# Patient Record
Sex: Female | Born: 1995 | Race: White | Hispanic: No | Marital: Single | Smoking: Never smoker
Health system: Southern US, Community
[De-identification: ages and names within clinical notes are randomized; demographics above are authoritative.]

## PROBLEM LIST (undated history)

## (undated) DIAGNOSIS — J930 Spontaneous tension pneumothorax: Secondary | ICD-10-CM

## (undated) DIAGNOSIS — G43909 Migraine, unspecified, not intractable, without status migrainosus: Secondary | ICD-10-CM

## (undated) HISTORY — DX: Spontaneous tension pneumothorax: J93.0

## (undated) HISTORY — PX: WISDOM TOOTH EXTRACTION: SHX21

## (undated) HISTORY — PX: OTHER SURGICAL HISTORY: SHX169

## (undated) HISTORY — PX: TYMPANOSTOMY TUBE PLACEMENT: SHX32

## (undated) HISTORY — DX: Migraine, unspecified, not intractable, without status migrainosus: G43.909

---
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
## 2014-01-17 ENCOUNTER — Encounter: Payer: Self-pay | Admitting: Neurology

## 2014-01-17 ENCOUNTER — Ambulatory Visit (INDEPENDENT_AMBULATORY_CARE_PROVIDER_SITE_OTHER): Payer: BC Managed Care – PPO | Admitting: Neurology

## 2014-01-17 VITALS — BP 93/57 | HR 71 | Ht 66.0 in | Wt 125.0 lb

## 2014-01-17 DIAGNOSIS — G43009 Migraine without aura, not intractable, without status migrainosus: Secondary | ICD-10-CM

## 2014-01-17 NOTE — Progress Notes (Signed)
[facility]    Provider:  Dr Lucia Gaskins Referring Provider: No ref. provider found Primary Care Physician:  No primary care provider on file.  CC: Double vision  HPI: Lindsey Fitzgerald is a 19 y.o. female here as a follow up for migraines. She stopped the Topamax due to double vision. She is doing very well. Has had only 3 headaches. They lasted 2 hours or longer. She tried to sleep through them. Imitrex stopped her dad's heart when he was a teenager so she is very hesitant to try triptans. Toradol makes her hyper. Has not tried reglan. She uses fioricet and cambia acutely.    History of migraines from last appointment: The migraines started in 2nd grade. Father has migraines. Migraines start sometimes without an aura and the feel like general pain throughout head, at the back of the head, deep ehind eyes and something is burning a path from the back of her head to her eyes. Other times feels like someone is hitting her head with a hammer. She also gets pressure-type headaches. Headaches were 7-8/10 and would last from 30 minutes to days. Longest one was 4 days. Would have them 4-6 a week. +nausea. +phonophobia. Sleeping may help when it gets really bad. Only medication has really helped in the past. Triggers include weather changes such as bad thunderstorms, really cold or hot. She gets 8 hours of sleep. Is on regular sleep/wake cycle. MSG makes it worse as does artificial sugar. She was started on topamax  at night which has improved headaches.  caused hand paresthesias. Butterbur didn't help. Not sure if magnesium helped. Takes advil once a week for rescue. Doesn't take fioricet. No focal neurologic deficits during the headaches. Not sexually active. Not interested in having children anytime soon. She just started [employer]   Review of Systems: Patient complains of symptoms per HPI as well as the following symptoms: bruises/bleeds easily, nervous/anxious. Pertinent negatives  per HPI. All others negative.   History   Social History  . Marital Status: Single    Spouse Name: N/A    Number of Children: 0  . Years of Education: 12   Occupational History  . Not on file.   Social History Main Topics  . Smoking status: Never Smoker   . Smokeless tobacco: Never Used  . Alcohol Use: No  . Drug Use: No  . Sexual Activity: Not on file   Other Topics Concern  . Not on file   Social History Narrative   Patient is single with no children.   Patient is right handed.   Patient has a high school education and currently in college.   Patient drinks caffeine occasionally.    Family History  Problem Relation Age of Onset  . Diabetes Mother   . Hyperlipidemia Mother   . Hypertension Mother   . Other Mother     MENIERES  . Migraines Father   . Other Father     Brain Tumor  . Stroke Maternal Grandmother   . Stroke Maternal Grandfather     Past Medical History  Diagnosis Date  . Tension pneumothorax   . Premature baby   . Migraine     Past Surgical History  Procedure Laterality Date  . None      Current Outpatient Prescriptions  Medication Sig Dispense Refill  . Butalbital-APAP-Caffeine (FIORICET) 50-300-40 MG CAPS Take by mouth.    . Diclofenac Potassium (CAMBIA) 50 MG PACK Take 50 mg by mouth.    Marland Kitchen ibuprofen (ADVIL,MOTRIN)  600 MG tablet 1 tab by mouth every 8 hours as needed for pain    . mometasone (ELOCON) 0.1 % cream Apply to affected area daily    . propranolol ER (INDERAL LA) 60 MG 24 hr capsule Take 1 capsule (60 mg total) by mouth daily. 90 capsule 3   No current facility-administered medications for this visit.    Allergies as of 01/17/2014 - Review Complete 01/17/2014  Allergen Reaction Noted  . Ketorolac tromethamine Other (See Comments) 08/26/2013  . Sumatriptan Other (See Comments) 08/26/2013    Vitals: BP 93/57 mmHg  Pulse 71  Ht 5\' 6"  (1.676 m)  Wt 125 lb (56.7 kg)  BMI 20.19 kg/m2 Last Weight:  Wt Readings from  Last 1 Encounters:  01/17/14 125 lb (56.7 kg) (45 %*, Z = -0.12)   * Growth percentiles are based on CDC 2-20 Years data.   Last Height:   Ht Readings from Last 1 Encounters:  01/17/14 5\' 6"  (1.676 m) (75 %*, Z = 0.67)   * Growth percentiles are based on CDC 2-20 Years data.   Physical exam: Exam: Gen: NAD, conversant, well nourised, obese, well groomed                     CV: RRR, no MRG. No Carotid Bruits. No peripheral edema, warm, nontender Eyes: Conjunctivae clear without exudates or hemorrhage  Neuro: Detailed Neurologic Exam  Speech:    Speech is normal; fluent and spontaneous with normal comprehension.  Cognition:    The patient is oriented to person, place, and time;     recent and remote memory intact;     language fluent;     normal attention, concentration,     fund of knowledge Cranial Nerves:    The pupils are equal, round, and reactive to light. The fundi are normal and spontaneous venous pulsations are present. Visual fields are full to finger confrontation. Extraocular movements are intact. Trigeminal sensation is intact and the muscles of mastication are normal. The face is symmetric. The palate elevates in the midline. Hearing intact. Voice is normal. Shoulder shrug is normal. The tongue has normal motion without fasciculations.   Coordination:    Normal finger to nose and heel to shin. Normal rapid alternating movements.   Gait:    Heel-toe and tandem gait are normal.   Motor Observation:    No asymmetry, no atrophy, and no involuntary movements noted. Tone:    Normal muscle tone.    Posture:    Posture is normal. normal erect    Strength:    Strength is V/V in the upper and lower limbs.      Sensation: intact     Reflex Exam:  DTR's:    Deep tendon reflexes in the upper and lower extremities are normal bilaterally.   Toes:    The toes are downgoing bilaterally.   Clonus:    Clonus is absent.       Assessment/Plan:  19 year old here  for follow up of chronic migraines. Neuro exam is normal. She is on propranolol and doing well. Only 3 migraines since last being seen. Doesn't want a triptan. She has fioricet and cambia, neither has worked on her 3 headaches. She has not tried them together. Try them together for acute management, can try reglan next.   Naomie DeanAntonia Iman Reinertsen, MD  [facility] [address] [place], [place] [id number]  Phone [phone] Fax [phone]  A total of 20 minutes  was spent in with this patient. Over half this time was spent on counseling patient on the diagnosis and different therapeutic options available.

## 2014-01-26 ENCOUNTER — Other Ambulatory Visit: Payer: Self-pay | Admitting: Orthopedic Surgery

## 2014-01-26 DIAGNOSIS — M25562 Pain in left knee: Secondary | ICD-10-CM

## 2014-02-04 ENCOUNTER — Ambulatory Visit
Admission: RE | Admit: 2014-02-04 | Discharge: 2014-02-04 | Disposition: A | Discharge status: Home / Self Care | Payer: BC Managed Care – PPO | Source: Ambulatory Visit | Attending: Orthopedic Surgery | Admitting: Orthopedic Surgery

## 2014-02-04 ENCOUNTER — Other Ambulatory Visit: Payer: Self-pay

## 2014-02-04 DIAGNOSIS — M25562 Pain in left knee: Secondary | ICD-10-CM

## 2014-02-27 ENCOUNTER — Ambulatory Visit: Payer: No Typology Code available for payment source | Admitting: Neurology

## 2014-03-28 ENCOUNTER — Telehealth: Payer: Self-pay | Admitting: Neurology

## 2014-03-28 ENCOUNTER — Other Ambulatory Visit: Payer: Self-pay | Admitting: *Deleted

## 2014-03-28 DIAGNOSIS — G43009 Migraine without aura, not intractable, without status migrainosus: Secondary | ICD-10-CM

## 2014-03-28 MED ORDER — PROCHLORPERAZINE EDISYLATE 5 MG/ML IJ SOLN: 10.0000 mg | Freq: Once | INTRAMUSCULAR | Status: DC

## 2014-03-28 MED ORDER — VALPROATE SODIUM 500 MG/5ML IV SOLN: 1000.0000 mg | INTRAVENOUS | Status: DC

## 2014-03-28 MED ORDER — VALPROATE SODIUM 500 MG/5ML IV SOLN: 500.0000 mg | INTRAVENOUS | Status: DC

## 2014-03-28 MED ORDER — SODIUM CHLORIDE 0.9 % IV SOLN: 500.0000 mg | INTRAVENOUS | Status: DC

## 2014-03-28 NOTE — Telephone Encounter (Signed)
Spoke with Dr. Lucia GaskinsAhern and she said she spoke with pt and told her to come at 300pm today for a migraine cocktail with Inetta Fermoina. I informed Inetta Fermoina and she is aware.

## 2014-03-28 NOTE — Telephone Encounter (Signed)
Patient stated she's having a really bad migraine and medication isn't helping.Marland Kitchen.Marland Kitchen.Please call and advise.

## 2014-03-28 NOTE — Telephone Encounter (Signed)
Kara Meadmma - can you let patient know to schedule a follow up with me in the office if headaches continue to worsen? Thanks

## 2014-04-10 ENCOUNTER — Encounter: Payer: Self-pay | Admitting: Certified Nurse Midwife

## 2014-04-10 ENCOUNTER — Ambulatory Visit (INDEPENDENT_AMBULATORY_CARE_PROVIDER_SITE_OTHER): Payer: BC Managed Care – PPO | Admitting: Certified Nurse Midwife

## 2014-04-10 VITALS — BP 100/64 | HR 68 | Resp 16 | Ht 65.0 in | Wt 123.0 lb

## 2014-04-10 DIAGNOSIS — Z3009 Encounter for other general counseling and advice on contraception: Secondary | ICD-10-CM

## 2014-04-10 DIAGNOSIS — Z0000 Encounter for general adult medical examination without abnormal findings: Secondary | ICD-10-CM

## 2014-04-10 DIAGNOSIS — Z01419 Encounter for gynecological examination (general) (routine) without abnormal findings: Secondary | ICD-10-CM

## 2014-04-10 LAB — POCT URINALYSIS DIPSTICK
BILIRUBIN UA: NEGATIVE
Glucose, UA: NEGATIVE
KETONES UA: NEGATIVE
Leukocytes, UA: NEGATIVE
Nitrite, UA: NEGATIVE
PH UA: 5
PROTEIN UA: NEGATIVE
RBC UA: NEGATIVE
Urobilinogen, UA: NEGATIVE

## 2014-04-10 NOTE — Progress Notes (Signed)
Reviewed personally.  M. Suzanne Aundreya Souffrant, MD.  

## 2014-04-10 NOTE — Progress Notes (Signed)
19 y.o. G0P0000 Single  Caucasian Fe here to establish gyn care, first gyn visit and  for annual exam. Periods normal, occasional cramping that requires OTC. Duration 5 days. History of migraine headache with aura with medication management with Neurology Dr. Ward Givens . Student at [employer]. Sees Urgent care if needed while here in [place]. Sexually active with one partner only and same for partner. Touching only. Interested in contraception options to be responsible and for protection from pregnancy. Mother aware patient is here today. No health issues today.  Patient's last menstrual period was 03/20/2014.          Sexually active: Yes.   but no intercourse.    Exercising: Yes.    gym & yoga Smoker:  no  Health Maintenance: Pap:  none MMG:  none Colonoscopy:  none BMD:   none TDaP:  5/15 Labs: Poct urine-neg Self breast exam: done monthly   reports that she has never smoked. She has never used smokeless tobacco. She reports that she does not drink alcohol or use illicit drugs.  Past Medical History  Diagnosis Date  . Tension pneumothorax   . Premature baby   . Migraine     Past Surgical History  Procedure Laterality Date  . None    . Wisdom tooth extraction    . Tympanostomy tube placement      Current Outpatient Prescriptions  Medication Sig Dispense Refill  . Butalbital-APAP-Caffeine (FIORICET) 50-300-40 MG CAPS Take by mouth.    . Diclofenac Potassium (CAMBIA) 50 MG PACK Take 50 mg by mouth.    Marland Kitchen ibuprofen (ADVIL,MOTRIN) 600 MG tablet 1 tab by mouth every 8 hours as needed for pain    . propranolol ER (INDERAL LA) 60 MG 24 hr capsule Take 1 capsule (60 mg total) by mouth daily. 90 capsule 3  . mometasone (ELOCON) 0.1 % cream Apply to affected area prn     Current Facility-Administered Medications  Medication Dose Route Frequency Provider Last Rate Last Dose  . prochlorperazine (COMPAZINE) injection 10 mg  10 mg Intravenous Once Anson Fret, MD        Family History   Problem Relation Age of Onset  . Diabetes Mother   . Hyperlipidemia Mother   . Hypertension Mother   . Other Mother     MENIERES  . Migraines Father   . Other Father     Brain Tumor  . Stroke Maternal Grandmother   . Stroke Maternal Grandfather   . Hypertension Maternal Grandfather   . Diabetes Paternal Uncle     ROS:  Pertinent items are noted in HPI.  Otherwise, a comprehensive ROS was negative.  Exam:   BP 100/64 mmHg  Pulse 68  Resp 16  Ht  (1.651 m)  Wt 123 lb (55.792 kg)  BMI 20.47 kg/m2  LMP 03/20/2014 Height:  (165.1 cm) Ht Readings from Last 3 Encounters:  04/10/14  (1.651 m) (61 %*, Z = 0.28)  01/17/14  (1.676 m) (75 %*, Z = 0.67)  09/02/13  (1.676 m) (75 %*, Z = 0.68)   * Growth percentiles are based on CDC 2-20 Years data.    General appearance: alert, cooperative and appears stated age Head: Normocephalic, without obvious abnormality, atraumatic Neck: no adenopathy, supple, symmetrical, trachea midline and thyroid normal to inspection and palpation Lungs: clear to auscultation bilaterally Breasts: normal appearance, no masses or tenderness, No nipple retraction or dimpling, No nipple discharge or bleeding, No axillary or supraclavicular adenopathy  Heart: regular rate and rhythm Abdomen: soft, non-tender; no masses,  no organomegaly Extremities: extremities normal, atraumatic, no cyanosis or edema Skin: Skin color, texture, turgor normal. No rashes or lesions Lymph nodes: Cervical, supraclavicular, and axillary nodes normal. No abnormal inguinal nodes palpated Neurologic: Grossly normal   Pelvic: External genitalia:  no lesions              Urethra:  normal appearing urethra with no masses, tenderness or lesions              Bartholin's and Skene's: normal                 Vagina: normal appearing vagina with normal color and discharge, no lesions virginal, hymen still intact              Cervix: non tender, no lesions, normal               Pap taken: No. Bimanual Exam:  Uterus:  normal size, contour, position, consistency, mobility, non-tender              Adnexa: normal adnexa and no mass, fullness, tenderness               Rectovaginal: Confirms               Anus:  normal appearnace  Chaperone present: Yes  A:  Well Woman with normal exam  Contraception desired   History of Migraine with aura  P:   Reviewed health and wellness pertinent to exam  Discussed risks and benefits of POP, Depo Provera, Nexplanon and IUD. Discussed not a candidate for estrogen products due to Migraine with aura and increase risk of stroke or DVT with use. Questions addressed regarding options. Patient shown Nexplanon device and feels this is a good choice for her due to schedule with college and remembering pills. Discussed insertion/removal, bleeding expectations, and need for insertion day 1-5 of menses and no concerns with pregnancy, so if becomes sexually active before insertion must use condoms. Patient has discussed Nexplanon with her mother and agrees a good choice. Will precert insurance and someone will call her with information and then she needs to call when menses starts.  Pap smear not taken today   counseled on breast self exam, STD prevention, HIV risk factors and prevention, family planning choices, adequate intake of calcium and vitamin D, diet and exercise  return annually or prn  An After Visit Summary was printed and given to the patient.

## 2014-04-10 NOTE — Patient Instructions (Signed)
General topics  Next pap or exam is  due in 1 year Take a Women's multivitamin Take 1200 mg. of calcium daily - prefer dietary If any concerns in interim to call back  Breast Self-Awareness Practicing breast self-awareness may pick up problems early, prevent significant medical complications, and possibly save your life. By practicing breast self-awareness, you can become familiar with how your breasts look and feel and if your breasts are changing. This allows you to notice changes early. It can also offer you some reassurance that your breast health is good. One way to learn what is normal for your breasts and whether your breasts are changing is to do a breast self-exam. If you find a lump or something that was not present in the past, it is best to contact your caregiver right away. Other findings that should be evaluated by your caregiver include nipple discharge, especially if it is bloody; skin changes or reddening; areas where the skin seems to be pulled in (retracted); or new lumps and bumps. Breast pain is seldom associated with cancer (malignancy), but should also be evaluated by a caregiver. BREAST SELF-EXAM The best time to examine your breasts is 5 7 days after your menstrual period is over.  ExitCare Patient Information 2013 [employer].   Exercise to Stay Healthy Exercise helps you become and stay healthy. EXERCISE IDEAS AND TIPS Choose exercises that:  You enjoy.  Fit into your day. You do not need to exercise really hard to be healthy. You can do exercises at a slow or medium level and stay healthy. You can:  Stretch before and after working out.  Try yoga, Pilates, or tai chi.  Lift weights.  Walk fast, swim, jog, run, climb stairs, bicycle, dance, or rollerskate.  Take aerobic classes. Exercises that burn about 150 calories:  Running 1  miles in 15 minutes.  Playing volleyball for 45 to 60 minutes.  Washing and waxing a car for 45 to 60  minutes.  Playing touch football for 45 minutes.  Walking 1  miles in 35 minutes.  Pushing a stroller 1  miles in 30 minutes.  Playing basketball for 30 minutes.  Raking leaves for 30 minutes.  Bicycling 5 miles in 30 minutes.  Walking 2 miles in 30 minutes.  Dancing for 30 minutes.  Shoveling snow for 15 minutes.  Swimming laps for 20 minutes.  Walking up stairs for 15 minutes.  Bicycling 4 miles in 15 minutes.  Gardening for 30 to 45 minutes.  Jumping rope for 15 minutes.  Washing windows or floors for 45 to 60 minutes. Document Released: 01/25/2010 Document Revised: 03/17/2011 Document Reviewed: 01/25/2010 ExitCare Patient Information 2013 [employer].   Other topics ( that may be useful information):    Sexually Transmitted Disease Sexually transmitted disease (STD) refers to any infection that is passed from person to person during sexual activity. This may happen by way of saliva, semen, blood, vaginal mucus, or urine. Common STDs include:  Gonorrhea.  Chlamydia.  Syphilis.  HIV/AIDS.  Genital herpes.  Hepatitis B and C.  Trichomonas.  Human papillomavirus (HPV).  Pubic lice. CAUSES  An STD may be spread by bacteria, virus, or parasite. A person can get an STD by:  Sexual intercourse with an infected person.  Sharing sex toys with an infected person.  Sharing needles with an infected person.  Having intimate contact with the genitals, mouth, or rectal areas of an infected person. SYMPTOMS  Some people may not have any symptoms, but   they can still pass the infection to others. Different STDs have different symptoms. Symptoms include:  Painful or bloody urination.  Pain in the pelvis, abdomen, vagina, anus, throat, or eyes.  Skin rash, itching, irritation, growths, or sores (lesions). These usually occur in the genital or anal area.  Abnormal vaginal discharge.  Penile discharge in men.  Soft, flesh-colored skin growths in the  genital or anal area.  Fever.  Pain or bleeding during sexual intercourse.  Swollen glands in the groin area.  Yellow skin and eyes (jaundice). This is seen with hepatitis. DIAGNOSIS  To make a diagnosis, your caregiver may:  Take a medical history.  Perform a physical exam.  Take a specimen (culture) to be examined.  Examine a sample of discharge under a microscope.  Perform blood test TREATMENT   Chlamydia, gonorrhea, trichomonas, and syphilis can be cured with antibiotic medicine.  Genital herpes, hepatitis, and HIV can be treated, but not cured, with prescribed medicines. The medicines will lessen the symptoms.  Genital warts from HPV can be treated with medicine or by freezing, burning (electrocautery), or surgery. Warts may come back.  HPV is a virus and cannot be cured with medicine or surgery.However, abnormal areas may be followed very closely by your caregiver and may be removed from the cervix, vagina, or vulva through office procedures or surgery. If your diagnosis is confirmed, your recent sexual partners need treatment. This is true even if they are symptom-free or have a negative culture or evaluation. They should not have sex until their caregiver says it is okay. HOME CARE INSTRUCTIONS  All sexual partners should be informed, tested, and treated for all STDs.  Take your antibiotics as directed. Finish them even if you start to feel better.  Only take over-the-counter or prescription medicines for pain, discomfort, or fever as directed by your caregiver.  Rest.  Eat a balanced diet and drink enough fluids to keep your urine clear or pale yellow.  Do not have sex until treatment is completed and you have followed up with your caregiver. STDs should be checked after treatment.  Keep all follow-up appointments, Pap tests, and blood tests as directed by your caregiver.  Only use latex condoms and water-soluble lubricants during sexual activity. Do not use  petroleum jelly or oils.  Avoid alcohol and illegal drugs.  Get vaccinated for HPV and hepatitis. If you have not received these vaccines in the past, talk to your caregiver about whether one or both might be right for you.  Avoid risky sex practices that can break the skin. The only way to avoid getting an STD is to avoid all sexual activity.Latex condoms and dental dams (for oral sex) will help lessen the risk of getting an STD, but will not completely eliminate the risk. SEEK MEDICAL CARE IF:   You have a fever.  You have any new or worsening symptoms. Document Released: 03/15/2002 Document Revised: 03/17/2011 Document Reviewed: 03/22/2010 [facility] Patient Information 2013 [place].    Domestic Abuse You are being battered or abused if someone close to you hits, pushes, or physically hurts you in any way. You also are being abused if you are forced into activities. You are being sexually abused if you are forced to have sexual contact of any kind. You are being emotionally abused if you are made to feel worthless or if you are constantly threatened. It is important to remember that help is available. No one has the right to abuse you. PREVENTION OF FURTHER  ABUSE  Learn the warning signs of danger. This varies with situations but may include: the use of alcohol, threats, isolation from friends and family, or forced sexual contact. Leave if you feel that violence is going to occur.  If you are attacked or beaten, report it to the police so the abuse is documented. You do not have to press charges. The police can protect you while you or the attackers are leaving. Get the officer's name and badge number and a copy of the report.  Find someone you can trust and tell them what is happening to you: your caregiver, a nurse, clergy member, close friend or family member. Feeling ashamed is natural, but remember that you have done nothing wrong. No one deserves abuse. Document Released:  12/21/1999 Document Revised: 03/17/2011 Document Reviewed: 02/28/2010 ExitCare Patient Information 2013 [employer].    How Much is Too Much Alcohol? Drinking too much alcohol can cause injury, accidents, and health problems. These types of problems can include:   Car crashes.  Falls.  Family fighting (domestic violence).  Drowning.  Fights.  Injuries.  Burns.  Damage to certain organs.  Having a baby with birth defects. ONE DRINK CAN BE TOO MUCH WHEN YOU ARE:  Working.  Pregnant or breastfeeding.  Taking medicines. Ask your doctor.  Driving or planning to drive. If you or someone you know has a drinking problem, get help from a doctor.  Document Released: 10/19/2008 Document Revised: 03/17/2011 Document Reviewed: 10/19/2008 ExitCare Patient Information 2013 [employer].   Smoking Hazards Smoking cigarettes is extremely bad for your health. Tobacco smoke has over 200 known poisons in it. There are over 60 chemicals in tobacco smoke that cause cancer. Some of the chemicals found in cigarette smoke include:   Cyanide.  Benzene.  Formaldehyde.  Methanol (wood alcohol).  Acetylene (fuel used in welding torches).  Ammonia. Cigarette smoke also contains the poisonous gases nitrogen oxide and carbon monoxide.  Cigarette smokers have an increased risk of many serious medical problems and Smoking causes approximately:  90% of all lung cancer deaths in men.  80% of all lung cancer deaths in women.  90% of deaths from chronic obstructive lung disease. Compared with nonsmokers, smoking increases the risk of:  Coronary heart disease by 2 to 4 times.  Stroke by 2 to 4 times.  Men developing lung cancer by 23 times.  Women developing lung cancer by 13 times.  Dying from chronic obstructive lung diseases by 12 times.  . Smoking is the most preventable cause of death and disease in our society.  WHY IS SMOKING ADDICTIVE?  Nicotine is the chemical  agent in tobacco that is capable of causing addiction or dependence.  When you smoke and inhale, nicotine is absorbed rapidly into the bloodstream through your lungs. Nicotine absorbed through the lungs is capable of creating a powerful addiction. Both inhaled and non-inhaled nicotine may be addictive.  Addiction studies of cigarettes and spit tobacco show that addiction to nicotine occurs mainly during the teen years, when young people begin using tobacco products. WHAT ARE THE BENEFITS OF QUITTING?  There are many health benefits to quitting smoking.   Likelihood of developing cancer and heart disease decreases. Health improvements are seen almost immediately.  Blood pressure, pulse rate, and breathing patterns start returning to normal soon after quitting. QUITTING SMOKING   American Lung Association - 1-800-LUNGUSA  American Cancer Society - 1-800-ACS-2345 Document Released: 01/31/2004 Document Revised: 03/17/2011 Document Reviewed: 10/04/2008 ExitCare Patient Information 2013 ExitCare,   LLC.   Stress Management Stress is a state of physical or mental tension that often results from changes in your life or normal routine. Some common causes of stress are:  Death of a loved one.  Injuries or severe illnesses.  Getting fired or changing jobs.  Moving into a new home. Other causes may be:  Sexual problems.  Business or financial losses.  Taking on a large debt.  Regular conflict with someone at home or at work.  Constant tiredness from lack of sleep. It is not just bad things that are stressful. It may be stressful to:  Win the lottery.  Get married.  Buy a new car. The amount of stress that can be easily tolerated varies from person to person. Changes generally cause stress, regardless of the types of change. Too much stress can affect your health. It may lead to physical or emotional problems. Too little stress (boredom) may also become stressful. SUGGESTIONS TO  REDUCE STRESS:  Talk things over with your family and friends. It often is helpful to share your concerns and worries. If you feel your problem is serious, you may want to get help from a professional counselor.  Consider your problems one at a time instead of lumping them all together. Trying to take care of everything at once may seem impossible. List all the things you need to do and then start with the most important one. Set a goal to accomplish 2 or 3 things each day. If you expect to do too many in a single day you will naturally fail, causing you to feel even more stressed.  Do not use alcohol or drugs to relieve stress. Although you may feel better for a short time, they do not remove the problems that caused the stress. They can also be habit forming.  Exercise regularly - at least 3 times per week. Physical exercise can help to relieve that "uptight" feeling and will relax you.  The shortest distance between despair and hope is often a good night's sleep.  Go to bed and get up on time allowing yourself time for appointments without being rushed.  Take a short "time-out" period from any stressful situation that occurs during the day. Close your eyes and take some deep breaths. Starting with the muscles in your face, tense them, hold it for a few seconds, then relax. Repeat this with the muscles in your neck, shoulders, hand, stomach, back and legs.  Take good care of yourself. Eat a balanced diet and get plenty of rest.  Schedule time for having fun. Take a break from your daily routine to relax. HOME CARE INSTRUCTIONS   Call if you feel overwhelmed by your problems and feel you can no longer manage them on your own.  Return immediately if you feel like hurting yourself or someone else. Document Released: 06/18/2000 Document Revised: 03/17/2011 Document Reviewed: 02/08/2007 ExitCare Patient Information 2013 [employer]Care[employer].  Sexually Transmitted Disease A sexually transmitted  disease (STD) is a disease or infection that may be passed (transmitted) from person to person, usually during sexual activity. This may happen by way of saliva, semen, blood, vaginal mucus, or urine. Common STDs include:   Gonorrhea.   Chlamydia.   Syphilis.   HIV and AIDS.   Genital herpes.   Hepatitis B and C.   Trichomonas.   Human papillomavirus (HPV).   Pubic lice.   Scabies.  Mites.  Bacterial vaginosis. WHAT ARE CAUSES OF STDs? An STD may be caused by   bacteria, a virus, or parasites. STDs are often transmitted during sexual activity if one person is infected. However, they may also be transmitted through nonsexual means. STDs may be transmitted after:   Sexual intercourse with an infected person.   Sharing sex toys with an infected person.   Sharing needles with an infected person or using unclean piercing or tattoo needles.  Having intimate contact with the genitals, mouth, or rectal areas of an infected person.   Exposure to infected fluids during birth. WHAT ARE THE SIGNS AND SYMPTOMS OF STDs? Different STDs have different symptoms. Some people may not have any symptoms. If symptoms are present, they may include:   Painful or bloody urination.   Pain in the pelvis, abdomen, vagina, anus, throat, or eyes.   A skin rash, itching, or irritation.  Growths, ulcerations, blisters, or sores in the genital and anal areas.  Abnormal vaginal discharge with or without bad odor.   Penile discharge in men.   Fever.   Pain or bleeding during sexual intercourse.   Swollen glands in the groin area.   Yellow skin and eyes (jaundice). This is seen with hepatitis.   Swollen testicles.  Infertility.  Sores and blisters in the mouth. HOW ARE STDs DIAGNOSED? To make a diagnosis, your health care provider may:   Take a medical history.   Perform a physical exam.   Take a sample of any discharge to examine.  Swab the throat, cervix,  opening to the penis, rectum, or vagina for testing.  Test a sample of your first morning urine.   Perform blood tests.   Perform a Pap test, if this applies.   Perform a colposcopy.   Perform a laparoscopy.  HOW ARE STDs TREATED? Treatment depends on the STD. Some STDs may be treated but not cured.   Chlamydia, gonorrhea, trichomonas, and syphilis can be cured with antibiotic medicine.   Genital herpes, hepatitis, and HIV can be treated, but not cured, with prescribed medicines. The medicines lessen symptoms.   Genital warts from HPV can be treated with medicine or by freezing, burning (electrocautery), or surgery. Warts may come back.   HPV cannot be cured with medicine or surgery. However, abnormal areas may be removed from the cervix, vagina, or vulva.   If your diagnosis is confirmed, your recent sexual partners need treatment. This is true even if they are symptom-free or have a negative culture or evaluation. They should not have sex until their health care providers say it is okay. HOW CAN I REDUCE MY RISK OF GETTING AN STD? Take these steps to reduce your risk of getting an STD:  Use latex condoms, dental dams, and water-soluble lubricants during sexual activity. Do not use petroleum jelly or oils.  Avoid having multiple sex partners.  Do not have sex with someone who has other sex partners.  Do not have sex with anyone you do not know or who is at high risk for an STD.  Avoid risky sex practices that can break your skin.  Do not have sex if you have open sores on your mouth or skin.  Avoid drinking too much alcohol or taking illegal drugs. Alcohol and drugs can affect your judgment and put you in a vulnerable position.  Avoid engaging in oral and anal sex acts.  Get vaccinated for HPV and hepatitis. If you have not received these vaccines in the past, talk to your health care provider about whether one or both might be right for you.     If you are at  risk of being infected with HIV, it is recommended that you take a prescription medicine daily to prevent HIV infection. This is called pre-exposure prophylaxis (PrEP). You are considered at risk if:  You are a man who has sex with other men (MSM).  You are a heterosexual man or woman and are sexually active with more than one partner.  You take drugs by injection.  You are sexually active with a partner who has HIV.  Talk with your health care provider about whether you are at high risk of being infected with HIV. If you choose to begin PrEP, you should first be tested for HIV. You should then be tested every 3 months for as long as you are taking PrEP.  WHAT SHOULD I DO IF I THINK I HAVE AN STD?  See your health care provider.   Tell your sexual partner(s). They should be tested and treated for any STDs.  Do not have sex until your health care provider says it is okay. WHEN SHOULD I GET IMMEDIATE MEDICAL CARE? Contact your health care provider right away if:   You have severe abdominal pain.  You are a man and notice swelling or pain in your testicles.  You are a woman and notice swelling or pain in your vagina. Document Released: 03/15/2002 Document Revised: 12/28/2012 Document Reviewed: 07/13/2012 [facility] Patient Information 2015 [place], [place]. This information is not intended to replace advice given to you by your health care provider. Make sure you discuss any questions you have with your health care provider. Etonogestrel implant What is this medicine? ETONOGESTREL (et oh noe JES trel) is a contraceptive (birth control) device. It is used to prevent pregnancy. It can be used for up to 3 years. This medicine may be used for other purposes; ask your health care provider or pharmacist if you have questions. COMMON BRAND NAME(S): Implanon, Nexplanon What should I tell my health care provider before I take this medicine? They need to know if you have any of these  conditions: -abnormal vaginal bleeding -blood vessel disease or blood clots -cancer of the breast, cervix, or liver -depression -diabetes -gallbladder disease -headaches -heart disease or recent heart attack -high blood pressure -high cholesterol -kidney disease -liver disease -renal disease -seizures -tobacco smoker -an unusual or allergic reaction to etonogestrel, other hormones, anesthetics or antiseptics, medicines, foods, dyes, or preservatives -pregnant or trying to get pregnant -breast-feeding How should I use this medicine? This device is inserted just under the skin on the inner side of your upper arm by a health care professional. Talk to your pediatrician regarding the use of this medicine in children. Special care may be needed. Overdosage: If you think you've taken too much of this medicine contact a poison control center or emergency room at once. Overdosage: If you think you have taken too much of this medicine contact a poison control center or emergency room at once. NOTE: This medicine is only for you. Do not share this medicine with others. What if I miss a dose? This does not apply. What may interact with this medicine? Do not take this medicine with any of the following medications: -amprenavir -bosentan -fosamprenavir This medicine may also interact with the following medications: -barbiturate medicines for inducing sleep or treating seizures -certain medicines for fungal infections like ketoconazole and itraconazole -griseofulvin -medicines to treat seizures like carbamazepine, felbamate, oxcarbazepine, phenytoin, topiramate -modafinil -phenylbutazone -rifampin -some medicines to treat HIV infection like atazanavir, indinavir, lopinavir, nelfinavir, tipranavir,  ritonavir -St. John's wort This list may not describe all possible interactions. Give your health care provider a list of all the medicines, herbs, non-prescription drugs, or dietary supplements  you use. Also tell them if you smoke, drink alcohol, or use illegal drugs. Some items may interact with your medicine. What should I watch for while using this medicine? This product does not protect you against HIV infection (AIDS) or other sexually transmitted diseases. You should be able to feel the implant by pressing your fingertips over the skin where it was inserted. Tell your doctor if you cannot feel the implant. What side effects may I notice from receiving this medicine? Side effects that you should report to your doctor or health care professional as soon as possible: -allergic reactions like skin rash, itching or hives, swelling of the face, lips, or tongue -breast lumps -changes in vision -confusion, trouble speaking or understanding -dark urine -depressed mood -general ill feeling or flu-like symptoms -light-colored stools -loss of appetite, nausea -right upper belly pain -severe headaches -severe pain, swelling, or tenderness in the abdomen -shortness of breath, chest pain, swelling in a leg -signs of pregnancy -sudden numbness or weakness of the face, arm or leg -trouble walking, dizziness, loss of balance or coordination -unusual vaginal bleeding, discharge -unusually weak or tired -yellowing of the eyes or skin Side effects that usually do not require medical attention (Report these to your doctor or health care professional if they continue or are bothersome.): -acne -breast pain -changes in weight -cough -fever or chills -headache -irregular menstrual bleeding -itching, burning, and vaginal discharge -pain or difficulty passing urine -sore throat This list may not describe all possible side effects. Call your doctor for medical advice about side effects. You may report side effects to FDA at 1-800-FDA-1088. Where should I keep my medicine? This drug is given in a hospital or clinic and will not be stored at home. NOTE: This sheet is a summary. It may not  cover all possible information. If you have questions about this medicine, talk to your doctor, pharmacist, or health care provider.  2015, [employer]. (2011-06-30 15:37:45)

## 2014-04-11 ENCOUNTER — Telehealth: Payer: Self-pay | Admitting: Certified Nurse Midwife

## 2014-04-11 NOTE — Telephone Encounter (Signed)
Call to patient. Advised of benefit quote received for Nexplanon. Patient agreeable. Patient is to call with cycle.

## 2014-04-12 ENCOUNTER — Telehealth: Payer: Self-pay | Admitting: Certified Nurse Midwife

## 2014-04-12 NOTE — Telephone Encounter (Signed)
Patient was told to call when she started her cycle. Last seen 04/10/14.

## 2014-04-12 NOTE — Telephone Encounter (Signed)
Spoke with patient. Patient started cycle today and would like to schedule nexplanon insertion. Requesting appointment for tomorrow.Appointment scheduled for tomorrow at 10 am with Verner Choleborah S. Leonard CNM. Patient is agreeable to date and time.   Routing to provider for final review. Patient agreeable to disposition. Will close encounter

## 2014-04-13 ENCOUNTER — Encounter: Payer: Self-pay | Admitting: Certified Nurse Midwife

## 2014-04-13 ENCOUNTER — Ambulatory Visit (INDEPENDENT_AMBULATORY_CARE_PROVIDER_SITE_OTHER): Payer: BC Managed Care – PPO | Admitting: Certified Nurse Midwife

## 2014-04-13 VITALS — BP 102/68 | HR 70 | Resp 16 | Ht 65.0 in | Wt 125.0 lb

## 2014-04-13 DIAGNOSIS — Z308 Encounter for other contraceptive management: Secondary | ICD-10-CM

## 2014-04-13 DIAGNOSIS — Z30017 Encounter for initial prescription of implantable subdermal contraceptive: Secondary | ICD-10-CM

## 2014-04-13 DIAGNOSIS — Z3009 Encounter for other general counseling and advice on contraception: Secondary | ICD-10-CM

## 2014-04-13 LAB — POCT URINE PREGNANCY: PREG TEST UR: NEGATIVE

## 2014-04-13 NOTE — Patient Instructions (Signed)
Etonogestrel implant What is this medicine? ETONOGESTREL (et oh noe JES trel) is a contraceptive (birth control) device. It is used to prevent pregnancy. It can be used for up to 3 years. This medicine may be used for other purposes; ask your health care provider or pharmacist if you have questions. COMMON BRAND NAME(S): Implanon, Nexplanon What should I tell my health care provider before I take this medicine? They need to know if you have any of these conditions: -abnormal vaginal bleeding -blood vessel disease or blood clots -cancer of the breast, cervix, or liver -depression -diabetes -gallbladder disease -headaches -heart disease or recent heart attack -high blood pressure -high cholesterol -kidney disease -liver disease -renal disease -seizures -tobacco smoker -an unusual or allergic reaction to etonogestrel, other hormones, anesthetics or antiseptics, medicines, foods, dyes, or preservatives -pregnant or trying to get pregnant -breast-feeding How should I use this medicine? This device is inserted just under the skin on the inner side of your upper arm by a health care professional. Talk to your pediatrician regarding the use of this medicine in children. Special care may be needed. Overdosage: If you think you've taken too much of this medicine contact a poison control center or emergency room at once. Overdosage: If you think you have taken too much of this medicine contact a poison control center or emergency room at once. NOTE: This medicine is only for you. Do not share this medicine with others. What if I miss a dose? This does not apply. What may interact with this medicine? Do not take this medicine with any of the following medications: -amprenavir -bosentan -fosamprenavir This medicine may also interact with the following medications: -barbiturate medicines for inducing sleep or treating seizures -certain medicines for fungal infections like ketoconazole and  itraconazole -griseofulvin -medicines to treat seizures like carbamazepine, felbamate, oxcarbazepine, phenytoin, topiramate -modafinil -phenylbutazone -rifampin -some medicines to treat HIV infection like atazanavir, indinavir, lopinavir, nelfinavir, tipranavir, ritonavir -St. John's wort This list may not describe all possible interactions. Give your health care provider a list of all the medicines, herbs, non-prescription drugs, or dietary supplements you use. Also tell them if you smoke, drink alcohol, or use illegal drugs. Some items may interact with your medicine. What should I watch for while using this medicine? This product does not protect you against HIV infection (AIDS) or other sexually transmitted diseases. You should be able to feel the implant by pressing your fingertips over the skin where it was inserted. Tell your doctor if you cannot feel the implant. What side effects may I notice from receiving this medicine? Side effects that you should report to your doctor or health care professional as soon as possible: -allergic reactions like skin rash, itching or hives, swelling of the face, lips, or tongue -breast lumps -changes in vision -confusion, trouble speaking or understanding -dark urine -depressed mood -general ill feeling or flu-like symptoms -light-colored stools -loss of appetite, nausea -right upper belly pain -severe headaches -severe pain, swelling, or tenderness in the abdomen -shortness of breath, chest pain, swelling in a leg -signs of pregnancy -sudden numbness or weakness of the face, arm or leg -trouble walking, dizziness, loss of balance or coordination -unusual vaginal bleeding, discharge -unusually weak or tired -yellowing of the eyes or skin Side effects that usually do not require medical attention (Report these to your doctor or health care professional if they continue or are bothersome.): -acne -breast pain -changes in  weight -cough -fever or chills -headache -irregular menstrual bleeding -itching, burning, and   vaginal discharge -pain or difficulty passing urine -sore throat This list may not describe all possible side effects. Call your doctor for medical advice about side effects. You may report side effects to FDA at 1-800-FDA-1088. Where should I keep my medicine? This drug is given in a hospital or clinic and will not be stored at home. NOTE: This sheet is a summary. It may not cover all possible information. If you have questions about this medicine, talk to your doctor, pharmacist, or health care provider.  2015, Elsevier/Gold Standard. (2011-06-30 15:37:45)  

## 2014-04-13 NOTE — Progress Notes (Signed)
Reviewed personally.  M. Suzanne Arretta Toenjes, MD.  

## 2014-04-13 NOTE — Progress Notes (Signed)
18 yrsCaucasian Single female G0P0000  presents for Nexplanon insertion. LMP 04/12/14 menses continues today.       Contraception never sexually active. Patient has read information and discussed with mother. Feels this is good choice for her due to Migraine with aura history. No health issues today. Questions addressed.  Consent signed.  HPI neg. Recent aex 04/11/14  Exam: affect normal, orientation x 3 Healthy WNWD  Procedure: Patient placed supine on exam table with her left arm   flexed at the elbow and externally rotated.The insertion site was identified on the inner side of upper arm.  Two marks were made 8-10cm above the medial epicondyle of the humerus.  The first mark for insertion and the second mark 4 cm from the first. The insertion sited was cleansed with betadine solution. 2 ml of 1% Lidocaine was injected along the track of the insertion site. The /Nexaplon under sterile conditions was inserted in left arm. The implant was palpated in the arm after insertion. A small adhesive bandage placed over insertion site. The patient palpated the device for monitoring of the device. No active bleeding was noted.  A pressure bandage was place over the site.  Assessment:Nexaplon Insertion Pt tolerated procedure well.  Plan:Instructions and warning signs and symptoms given  Questions addressed regarding expectations again.  Return visit one month, prn

## 2014-04-25 ENCOUNTER — Ambulatory Visit: Payer: BC Managed Care – PPO | Admitting: Neurology

## 2014-04-26 ENCOUNTER — Encounter: Payer: Self-pay | Admitting: Neurology

## 2014-05-10 ENCOUNTER — Ambulatory Visit: Payer: BC Managed Care – PPO | Admitting: Certified Nurse Midwife

## 2014-05-19 ENCOUNTER — Ambulatory Visit: Payer: BC Managed Care – PPO | Admitting: Certified Nurse Midwife

## 2014-05-22 ENCOUNTER — Encounter: Payer: Self-pay | Admitting: Certified Nurse Midwife

## 2014-05-22 ENCOUNTER — Ambulatory Visit (INDEPENDENT_AMBULATORY_CARE_PROVIDER_SITE_OTHER): Payer: BC Managed Care – PPO | Admitting: Certified Nurse Midwife

## 2014-05-22 VITALS — BP 104/60 | HR 68 | Resp 16 | Ht 65.0 in | Wt 126.0 lb

## 2014-05-22 DIAGNOSIS — Z0000 Encounter for general adult medical examination without abnormal findings: Secondary | ICD-10-CM | POA: Diagnosis not present

## 2014-05-22 DIAGNOSIS — IMO0002 Reserved for concepts with insufficient information to code with codable children: Secondary | ICD-10-CM

## 2014-05-22 LAB — POCT URINE PREGNANCY: Preg Test, Ur: NEGATIVE

## 2014-05-22 NOTE — Progress Notes (Signed)
19 y.o. Single Caucasian female G0P0000 here for follow up of  Nexplanon insertion on 04/13/14. Patient had slight bruising after insertion, no problems with. Healed well. Had very light period and no cramping. Happy with choice. No concerns   O: Healthy WD,WN female Affect: orientation Skin:warm and dry Left arm Nexplanon palpated intact.   A:Nexplanon normal surveillance   P: Discussed findings of normal placement and appearance. Warnings signs reviewed and will advise if occurs.   RV prn aex

## 2014-05-22 NOTE — Patient Instructions (Signed)
Etonogestrel implant What is this medicine? ETONOGESTREL (et oh noe JES trel) is a contraceptive (birth control) device. It is used to prevent pregnancy. It can be used for up to 3 years. This medicine may be used for other purposes; ask your health care provider or pharmacist if you have questions. COMMON BRAND NAME(S): Implanon, Nexplanon What should I tell my health care provider before I take this medicine? They need to know if you have any of these conditions: -abnormal vaginal bleeding -blood vessel disease or blood clots -cancer of the breast, cervix, or liver -depression -diabetes -gallbladder disease -headaches -heart disease or recent heart attack -high blood pressure -high cholesterol -kidney disease -liver disease -renal disease -seizures -tobacco smoker -an unusual or allergic reaction to etonogestrel, other hormones, anesthetics or antiseptics, medicines, foods, dyes, or preservatives -pregnant or trying to get pregnant -breast-feeding How should I use this medicine? This device is inserted just under the skin on the inner side of your upper arm by a health care professional. Talk to your pediatrician regarding the use of this medicine in children. Special care may be needed. Overdosage: If you think you've taken too much of this medicine contact a poison control center or emergency room at once. Overdosage: If you think you have taken too much of this medicine contact a poison control center or emergency room at once. NOTE: This medicine is only for you. Do not share this medicine with others. What if I miss a dose? This does not apply. What may interact with this medicine? Do not take this medicine with any of the following medications: -amprenavir -bosentan -fosamprenavir This medicine may also interact with the following medications: -barbiturate medicines for inducing sleep or treating seizures -certain medicines for fungal infections like ketoconazole and  itraconazole -griseofulvin -medicines to treat seizures like carbamazepine, felbamate, oxcarbazepine, phenytoin, topiramate -modafinil -phenylbutazone -rifampin -some medicines to treat HIV infection like atazanavir, indinavir, lopinavir, nelfinavir, tipranavir, ritonavir -St. John's wort This list may not describe all possible interactions. Give your health care provider a list of all the medicines, herbs, non-prescription drugs, or dietary supplements you use. Also tell them if you smoke, drink alcohol, or use illegal drugs. Some items may interact with your medicine. What should I watch for while using this medicine? This product does not protect you against HIV infection (AIDS) or other sexually transmitted diseases. You should be able to feel the implant by pressing your fingertips over the skin where it was inserted. Tell your doctor if you cannot feel the implant. What side effects may I notice from receiving this medicine? Side effects that you should report to your doctor or health care professional as soon as possible: -allergic reactions like skin rash, itching or hives, swelling of the face, lips, or tongue -breast lumps -changes in vision -confusion, trouble speaking or understanding -dark urine -depressed mood -general ill feeling or flu-like symptoms -light-colored stools -loss of appetite, nausea -right upper belly pain -severe headaches -severe pain, swelling, or tenderness in the abdomen -shortness of breath, chest pain, swelling in a leg -signs of pregnancy -sudden numbness or weakness of the face, arm or leg -trouble walking, dizziness, loss of balance or coordination -unusual vaginal bleeding, discharge -unusually weak or tired -yellowing of the eyes or skin Side effects that usually do not require medical attention (Report these to your doctor or health care professional if they continue or are bothersome.): -acne -breast pain -changes in  weight -cough -fever or chills -headache -irregular menstrual bleeding -itching, burning, and   vaginal discharge -pain or difficulty passing urine -sore throat This list may not describe all possible side effects. Call your doctor for medical advice about side effects. You may report side effects to FDA at 1-800-FDA-1088. Where should I keep my medicine? This drug is given in a hospital or clinic and will not be stored at home. NOTE: This sheet is a summary. It may not cover all possible information. If you have questions about this medicine, talk to your doctor, pharmacist, or health care provider.  2015, Elsevier/Gold Standard. (2011-06-30 15:37:45)  

## 2014-05-23 NOTE — Progress Notes (Signed)
Reviewed personally.  M. Suzanne Arpi Diebold, MD.  

## 2014-07-17 ENCOUNTER — Telehealth: Payer: Self-pay | Admitting: Certified Nurse Midwife

## 2014-07-17 NOTE — Telephone Encounter (Signed)
Patient has a question regarding her nexplanon.

## 2014-07-17 NOTE — Telephone Encounter (Signed)
Spoke with patient. Patient had Nexplanon inserted on 04/13/2014. Has been experiencing spotting for 2 weeks. Denies any heavy bleeding or discomfort. Advised patient having some irregular bleeding is not uncommon for the first 3-6 months with the nexplanon as body adjusts. Advised if spotting continues and is uncomfortable to give us a call. Or is bleeding increases to normal cycle and becomes extended longer than 7 days will need to call as well. Patient is agreeable and verbalizes understanding.  Routing to provider for final review. Patient agreeable to disposition. Will close encounter.   Patient aware provider will review message and nurse will return call if any additional advice or change of disposition.

## 2014-08-21 ENCOUNTER — Telehealth: Payer: Self-pay | Admitting: Neurology

## 2014-08-21 ENCOUNTER — Ambulatory Visit (INDEPENDENT_AMBULATORY_CARE_PROVIDER_SITE_OTHER): Payer: BC Managed Care – PPO | Admitting: Neurology

## 2014-08-21 ENCOUNTER — Encounter: Payer: Self-pay | Admitting: Neurology

## 2014-08-21 VITALS — BP 99/63 | HR 85 | Ht 65.0 in | Wt 131.2 lb

## 2014-08-21 DIAGNOSIS — G43001 Migraine without aura, not intractable, with status migrainosus: Secondary | ICD-10-CM

## 2014-08-21 MED ORDER — ONDANSETRON 4 MG PO TBDP: 4.0000 mg | ORAL_TABLET | Freq: Three times a day (TID) | ORAL | Status: DC | PRN

## 2014-08-21 MED ORDER — PROPRANOLOL HCL ER 60 MG PO CP24: 60.0000 mg | ORAL_CAPSULE | Freq: Every day | ORAL | Status: DC

## 2014-08-21 MED ORDER — BUTALBITAL-APAP-CAFFEINE 50-300-40 MG PO CAPS: 1.0000 | ORAL_CAPSULE | Freq: Once | ORAL | Status: DC

## 2014-08-21 MED ORDER — METHYLPREDNISOLONE 4 MG PO TBPK: ORAL_TABLET | ORAL | Status: DC

## 2014-08-21 NOTE — Telephone Encounter (Signed)
Called Tammy back at [facility] pharmacy to clarify order was for [facility]. She verbalized understanding.

## 2014-08-21 NOTE — Telephone Encounter (Signed)
I called back.  Was on hold for over 20 minutes, but was finally able to speak with the pharmacist, Reuel Boom.  Verified Rx.  They will proceed with prescription as ordered, and will call back if anything further is needed.

## 2014-08-21 NOTE — Progress Notes (Signed)
Faxed Cambia enrollment form to [facility] at [phone]. Received fax confirmation. Copy sent to medical records.

## 2014-08-21 NOTE — Telephone Encounter (Signed)
[facility] [phone], [place] or [place]?

## 2014-08-21 NOTE — Patient Instructions (Signed)
Overall you are doing fairly well but I do want to suggest a few things today:   Remember to drink plenty of fluid, eat healthy meals and do not skip any meals. Try to eat protein with a every meal and eat a healthy snack such as fruit or nuts in between meals. Try to keep a regular sleep-wake schedule and try to exercise daily, particularly in the form of walking, 20-30 minutes a day, if you can.   As far as your medications are concerned, I would like to suggest:  Zofran at the onset of headache 5-day prednisone taper for allergic reaction Continue other medications as prescribed  I would like to see you back in 1 year, sooner if we need to. Please call us with any interim questions, concerns, problems, updates or refill requests.   Please also call us for any test results so we can go over those with you on the phone.  My clinical assistant and will answer any of your questions and relay your messages to me and also relay most of my messages to you.   Our phone number is [phone]. We also have an after hours call service for urgent matters and there is a physician on-call for urgent questions. For any emergencies you know to call 911 or go to the nearest emergency room

## 2014-08-21 NOTE — Progress Notes (Addendum)
[facility]    Provider:  Dr Lucia Gaskins Referring Provider: No ref. provider found Primary Care Physician:  No PCP Per Patient  [facility]    Provider: Dr Lucia Gaskins Referring Provider: No ref. provider found Primary Care Physician: No primary care provider on file.  CC: Double vision  Interval update 08/21/2014: Since the summer she has only had a few headaches. No side effects from the propranolol. Don't want to try a triptan due to bad reaction in her father. She has 1-2 smaller headaches every 1-2 weeks they last 2-3 hours and they can get a 4-5/10 just enough to be annoying. 4/month was 8/10. The cambia helps with acute management. She can have nausea with the migraines.   HPI: Lindsey Fitzgerald is a 19 y.o. female here as a follow up for migraines. She stopped the Topamax due to double vision. She is doing very well. Has had only 3 headaches. They lasted 2 hours or longer. She tried to sleep through them. Imitrex stopped her dad's heart when he was a teenager so she is very hesitant to try triptans. Toradol makes her hyper. Has not tried reglan. She uses fioricet and cambia acutely.    History of migraines from last appointment: The migraines started in 2nd grade. Father has migraines. Migraines start sometimes without an aura and the feel like general pain throughout head, at the back of the head, deep ehind eyes and something is burning a path from the back of her head to her eyes. Other times feels like someone is hitting her head with a hammer. She also gets pressure-type headaches. Headaches were 7-8/10 and would last from 30 minutes to days. Longest one was 4 days. Would have them 4-6 a week. +nausea. +phonophobia. Sleeping may help when it gets really bad. Only medication has really helped in the past. Triggers include weather changes such as bad thunderstorms, really cold or hot. She gets 8 hours of sleep. Is on regular sleep/wake cycle. MSG  makes it worse as does artificial sugar. She was started on topamax  at night which has improved headaches.  caused hand paresthesias. Butterbur didn't help. Not sure if magnesium helped. Takes advil once a week for rescue. Doesn't take fioricet. No focal neurologic deficits during the headaches. Not sexually active. Not interested in having children anytime soon. She just started [employer]    Review of Systems: Patient complains of symptoms per HPI as well as the following symptoms: frequent wakin, headache. Pertinent negatives per HPI. All others negative.   Social History   Social History  . Marital Status: Single    Spouse Name: N/A  . Number of Children: 0  . Years of Education: 12   Occupational History  . Not on file.   Social History Main Topics  . Smoking status: Never Smoker   . Smokeless tobacco: Never Used  . Alcohol Use: No  . Drug Use: No  . Sexual Activity:    Partners: Male    Birth Control/ Protection: Implant     Comment: nexplanon   Other Topics Concern  . Not on file   Social History Narrative   Patient is single with no children.   Patient is right handed.   Patient has a high school education and currently in college.   Patient drinks caffeine occasionally.    Family History  Problem Relation Age of Onset  . Diabetes Mother   . Hyperlipidemia Mother   . Hypertension Mother   . Other Mother  MENIERES  . Migraines Father   . Other Father     Brain Tumor  . Stroke Maternal Grandmother   . Stroke Maternal Grandfather   . Hypertension Maternal Grandfather   . Diabetes Paternal Uncle     Past Medical History  Diagnosis Date  . Tension pneumothorax   . Premature baby   . Migraine     Past Surgical History  Procedure Laterality Date  . None    . Wisdom tooth extraction    . Tympanostomy tube placement    . Nexplanon       insertion 04-13-14    Current Outpatient Prescriptions  Medication Sig Dispense Refill  . Diclofenac  Potassium (CAMBIA) 50 MG PACK Take 50 mg by mouth.    . EPINEPHrine 0.3 mg/0.3 mL IJ SOAJ injection Inject 0.3 mg into the skin.    Marland Kitchen escitalopram (LEXAPRO) 20 MG tablet Take 20 mg by mouth daily.    Marland Kitchen etonogestrel (NEXPLANON) 68 MG IMPL implant 1 each by Subdermal route once.    Marland Kitchen ibuprofen (ADVIL,MOTRIN) 600 MG tablet 1 tab by mouth every 8 hours as needed for pain    . NAPROXEN PO Take by mouth as needed.    . propranolol ER (INDERAL LA) 60 MG 24 hr capsule Take 1 capsule (60 mg total) by mouth daily. 90 capsule 3  . Butalbital-APAP-Caffeine (FIORICET) 50-300-40 MG CAPS Take by mouth.     Current Facility-Administered Medications  Medication Dose Route Frequency Provider Last Rate Last Dose  . prochlorperazine (COMPAZINE) injection 10 mg  10 mg Intravenous Once Anson Fret, MD        Allergies as of 08/21/2014 - Review Complete 08/21/2014  Allergen Reaction Noted  . Other Swelling 08/21/2014  . Ketorolac tromethamine Other (See Comments) 08/26/2013  . Penicillins  04/10/2014  . Sumatriptan  08/26/2013    Vitals: BP 99/63 mmHg  Pulse 85  Ht 5\' 5"  (1.651 m)  Wt 131 lb 3.2 oz (59.512 kg)  BMI 21.83 kg/m2 Last Weight:  Wt Readings from Last 1 Encounters:  08/21/14 131 lb 3.2 oz (59.512 kg)   Last Height:   Ht Readings from Last 1 Encounters:  08/21/14 5\' 5"  (1.651 m)    Physical exam: Exam: Gen: NAD, conversant, well nourised, obese, well groomed                     CV: RRR, no MRG. No Carotid Bruits. No peripheral edema, warm, nontender Eyes: Conjunctivae clear without exudates or hemorrhage  Neuro: Detailed Neurologic Exam  Speech:    Speech is normal; fluent and spontaneous with normal comprehension.  Cognition:    The patient is oriented to person, place, and time;     recent and remote memory intact;     language fluent;     normal attention, concentration,     fund of knowledge Cranial Nerves:    The pupils are equal, round, and reactive to light. The  fundi are normal and spontaneous venous pulsations are present. Visual fields are full to finger confrontation. Extraocular movements are intact. Trigeminal sensation is intact and the muscles of mastication are normal. The face is symmetric. The palate elevates in the midline. Hearing intact. Voice is normal. Shoulder shrug is normal. The tongue has normal motion without fasciculations.   Motor Observation:    No asymmetry, no atrophy, and no involuntary movements noted.   Posture:    Posture is normal. normal erect    Strength:  Strength is V/V in the upper and lower limbs.        Assessment/Plan:  19 year old with diagnoses of migraines. Neuro exam nml. Doing well on propranolol and Cambia. Continue current medications. @  Naomie Dean, MD  [facility] [address] [place], [place] [address]  Phone [phone] Fax [phone]  A total of 15 minutes was spent face-to-face with this patient. Over half this time was spent on counseling patient on the migraine diagnosis and different diagnostic and therapeutic options available.

## 2014-08-21 NOTE — Telephone Encounter (Signed)
Lindsey Fitzgerald with [facility]([address] [employer]) is calling in regard to Rx Fioricet 50-300-40 mg.  He needs clarification on the directions to take medication.  Please call [phone] mg.  Thanks!

## 2014-08-29 ENCOUNTER — Encounter: Payer: Self-pay | Admitting: *Deleted

## 2014-08-29 NOTE — Progress Notes (Signed)
Faxed prior authorization request for Cambia powder pack to [employer], pharm tech. Received fax confirmation. Copy sent to MR.

## 2014-09-11 NOTE — Progress Notes (Signed)
Ins has approved the request for coverage on Cambia effective until 02/24/2015

## 2014-10-11 ENCOUNTER — Telehealth: Payer: Self-pay | Admitting: Neurology

## 2014-10-11 NOTE — Telephone Encounter (Signed)
Called and spoke to pt. Asked her to come in and been seen by Darrol Angel, Np at [facility] for check-in at 845am. Dr. Lucia Gaskins has a full schedule and Eber Keval Nam is happy to see her. She verbalized understanding and is appreciative.

## 2014-10-11 NOTE — Telephone Encounter (Signed)
Patient called to advise she has had a really bad headache since last Thursday 10/05/14, finally went away yesterday, back today. States Diclofenac Potassium (CAMBIA) 50 MG PACK and Butalbital-APAP-Caffeine (FIORICET) 50-300-40 MG CAPS are not working and she doesn't want to go to ER.

## 2014-10-12 ENCOUNTER — Ambulatory Visit (INDEPENDENT_AMBULATORY_CARE_PROVIDER_SITE_OTHER): Payer: BC Managed Care – PPO | Admitting: Nurse Practitioner

## 2014-10-12 ENCOUNTER — Encounter: Payer: Self-pay | Admitting: Nurse Practitioner

## 2014-10-12 VITALS — BP 106/68 | HR 80 | Ht 65.0 in | Wt 132.5 lb

## 2014-10-12 DIAGNOSIS — G43909 Migraine, unspecified, not intractable, without status migrainosus: Secondary | ICD-10-CM

## 2014-10-12 MED ORDER — PREDNISONE 10 MG PO TABS: ORAL_TABLET | ORAL | Status: DC

## 2014-10-12 NOTE — Patient Instructions (Signed)
Prednisone 5 day dose pack take as directed Continue Inderal LA at current dose Continue Fioricet acutely Follow up as scheduled with Dr. Lucia Gaskins

## 2014-10-12 NOTE — Progress Notes (Addendum)
[facility]  PATIENT: Lindsey Fitzgerald DOB: 01-Nov-1995   REASON FOR VISIT: Follow-up for migraine that has lasted a week HISTORY FROM: Patient    HISTORY OF PRESENT ILLNESS: Lindsey Fitzgerald, 19 year old female returns for follow-up. She began having a migraine Thursday, September 29 which lasted for 2 days and then it came back on Sunday Oct 2,2016 and has been persistent. She has not taken Cambia she has only been taking the Fioricet. She has been nauseated and takes Zofran she has had a lot of photophobia. 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 She has very few foods that are triggers and she has tried to avoid those these would include artificial sugar and MSG. She has also been taking a lot of ibuprofen and was cautioned against this due to rebound. She returns for reevaluation. Except for this last week of headaches her  headache frequency is down from 4 times a week to 2 times a week. She continues to take Inderal LA as her preventive. She is afraid to use triptans as her father had cardiac arrest after using a triptan.    Dr. Lucia Gaskins Interval update 08/21/2014: Since the summer she has only had a few headaches. No side effects from the propranolol. Don't want to try a triptan due to bad reaction in her father. She has 1-2 smaller headaches every 1-2 weeks they last 2-3 hours and they can get a 4-5/10 just enough to be annoying. 4/month was 8/10. The cambia helps with acute management. She can have nausea with the migraines.   HPI: Lindsey Fitzgerald is a 19 y.o. female here as a follow up for migraines. She stopped the Topamax due to double vision. She is doing very well. Has had only 3 headaches. They lasted 2 hours or longer. She tried to sleep through them. Imitrex stopped her dad's heart when he was a teenager so she is very hesitant to try triptans. Toradol makes her hyper. Has not tried  reglan. She uses fioricet and cambia acutely.   The migraines started in 2nd grade. Father has migraines. Migraines start sometimes without an aura and the feel like general pain throughout head, at the back of the head, deep ehind eyes and something is burning a path from the back of her head to her eyes. Other times feels like someone is hitting her head with a hammer. She also gets pressure-type headaches. Headaches were 7-8/10 and would last from 30 minutes to days. Longest one was 4 days. Would have them 4-6 a week. +nausea. +phonophobia. Sleeping may help when it gets really bad. Only medication has really helped in the past. Triggers include weather changes such as bad thunderstorms, really cold or hot. She gets 8 hours of sleep. Is on regular sleep/wake cycle. MSG makes it worse as does artificial sugar. She was started on topamax  at night which has improved headaches.  caused hand paresthesias. Butterbur didn't help. Not sure if magnesium helped. Takes advil once a week for rescue. Doesn't take fioricet. No focal neurologic deficits during the headaches. Not sexually active. Not interested in having children anytime soon. She just started [employer]  REVIEW OF SYSTEMS: Full 14 system review of systems performed and notable only for those listed, all others are neg:  Constitutional: Fatigue  Cardiovascular: neg Ear/Nose/Throat: neg  Skin: neg Eyes: Light sensitivity, blurred vision Respiratory: neg Gastroitestinal: Nausea and  vomiting Hematology/Lymphatic: neg  Endocrine: neg Musculoskeletal:neg Allergy/Immunology: Food allergies Neurological: Dizziness headache Psychiatric: Depression and anxiety Sleep : Insomnia   ALLERGIES: Allergies  Allergen Reactions  . Other Swelling    Silk tape  Tongue swelling  . Ketorolac Tromethamine Other (See Comments)    INSOMNIA  . Sumatriptan     HOME MEDICATIONS: Outpatient Prescriptions Prior to Visit  Medication Sig Dispense Refill    . Butalbital-APAP-Caffeine (FIORICET) 50-300-40 MG CAPS Take 1 capsule by mouth once. 8 capsule 11  . Diclofenac Potassium (CAMBIA) 50 MG PACK Take 50 mg by mouth.    . EPINEPHrine 0.3 mg/0.3 mL IJ SOAJ injection Inject 0.3 mg into the skin.    Marland Kitchen escitalopram (LEXAPRO) 20 MG tablet Take 20 mg by mouth daily.    Marland Kitchen etonogestrel (NEXPLANON) 68 MG IMPL implant 1 each by Subdermal route once.    Marland Kitchen ibuprofen (ADVIL,MOTRIN) 600 MG tablet 1 tab by mouth every 8 hours as needed for pain    . NAPROXEN PO Take by mouth as needed.    . ondansetron (ZOFRAN-ODT) 4 MG disintegrating tablet Take 1 tablet (4 mg total) by mouth every 8 (eight) hours as needed for nausea. 30 tablet 11  . propranolol ER (INDERAL LA) 60 MG 24 hr capsule Take 1 capsule (60 mg total) by mouth daily. 90 capsule 3  . methylPREDNISolone (MEDROL DOSEPAK) 4 MG TBPK tablet follow package directions (Patient not taking: Reported on 10/12/2014) 21 tablet 0   Facility-Administered Medications Prior to Visit  Medication Dose Route Frequency Provider Last Rate Last Dose  . prochlorperazine (COMPAZINE) injection 10 mg  10 mg Intravenous Once Anson Fret, MD        PAST MEDICAL HISTORY: Past Medical History  Diagnosis Date  . Tension pneumothorax   . Premature baby   . Migraine     PAST SURGICAL HISTORY: Past Surgical History  Procedure Laterality Date  . None    . Wisdom tooth extraction    . Tympanostomy tube placement    . Nexplanon       insertion 04-13-14    FAMILY HISTORY: Family History  Problem Relation Age of Onset  . Diabetes Mother   . Hyperlipidemia Mother   . Hypertension Mother   . Other Mother     MENIERES  . Migraines Father   . Other Father     Brain Tumor  . Stroke Maternal Grandmother   . Stroke Maternal Grandfather   . Hypertension Maternal Grandfather   . Diabetes Paternal Uncle     SOCIAL HISTORY: Social History   Social History  . Marital Status: Single    Spouse Name: N/A  . Number of  Children: 0  . Years of Education: 12   Occupational History  . Not on file.   Social History Main Topics  . Smoking status: Never Smoker   . Smokeless tobacco: Never Used  . Alcohol Use: No  . Drug Use: No  . Sexual Activity:    Partners: Male    Birth Control/ Protection: Implant     Comment: nexplanon   Other Topics Concern  . Not on file   Social History Narrative   Patient is single with no children.   Patient is right handed.   Patient has a high school education and currently in college.   Patient drinks caffeine occasionally.     PHYSICAL EXAM  Filed Vitals:   10/12/14 0901  BP: 106/68  Pulse: 80  Height:  (1.651  m)  Weight: 132 lb 8 oz (60.102 kg)   Body mass index is 22.05 kg/(m^2).  Generalized: Well developed, in no acute distress  Head: normocephalic and atraumatic,. Oropharynx benign  Neck: Supple, no carotid bruits  Cardiac: Regular rate rhythm, no murmur  Musculoskeletal: No deformity   Neurological examination   Mentation: Alert oriented to time, place, history taking. Attention span and concentration appropriate. Recent and remote memory intact.  Follows all commands speech and language fluent.   Cranial nerve II-XII: Fundoscopic exam deferred due to headache .Pupils were equal round reactive to light extraocular movements were full, visual field were full on confrontational test. Facial sensation and strength were normal. hearing was intact to finger rubbing bilaterally. Uvula tongue midline. head turning and shoulder shrug were normal and symmetric.Tongue protrusion into cheek strength was normal. Motor: normal bulk and tone, full strength in the BUE, BLE, fine finger movements normal, no pronator drift. No focal weakness Sensory: normal and symmetric to light touch, pinprick, and  Vibration, proprioception  Coordination: finger-nose-finger, heel-to-shin bilaterally, no dysmetria Reflexes: Brachioradialis 2/2, biceps 2/2, triceps 2/2,  patellar 2/2, Achilles 2/2, plantar responses were flexor bilaterally. Gait and Station: Rising up from seated position without assistance, normal stance,  moderate stride, good arm swing, smooth turning, able to perform tiptoe, and heel walking without difficulty. Tandem gait is steady  DIAGNOSTIC DATA (LABS, IMAGING, TESTING) -  ASSESSMENT AND PLAN  19 y.o. year old female  has a past medical history of migraine which has lasted for a week.  PLAN: As far as your medications are concerned, I would like to suggest:  Continue Zofran at the onset of headache Limit use of ibuprofen as it can cause rebound headache, given information on rebound Begin 6-day prednisone Dosepak to hopefully break the current migraine take as directed, please use the entire prescription if this does not work please call to our office after it is concluded Continue other medications as prescribed Remember to drink plenty of fluid, eat healthy meals and do not skip any meals. Try to eat protein with a every meal and eat a healthy snack such as fruit or nuts in between meals. Try to keep a regular sleep-wake schedule and try to exercise daily, particularly in the form of walking, 20-30 minutes a day, if you can.  Follow-up is planned with Dr. Lucia Gaskins Vst time 25 min Nilda Riggs, [facility], [facility], APRN  [facility] [address], [address] [place], [place] [address] [phone]  Personally  participated in and made any corrections needed to history, physical, neuro exam,assessment and plan as stated above.  Naomie Dean, MD [facility]

## 2014-10-22 ENCOUNTER — Encounter (HOSPITAL_COMMUNITY): Payer: Self-pay | Admitting: *Deleted

## 2014-10-22 ENCOUNTER — Emergency Department (HOSPITAL_COMMUNITY)
Admission: EM | Admit: 2014-10-22 | Discharge: 2014-10-22 | Disposition: A | Discharge status: Home / Self Care | Payer: BC Managed Care – PPO | Attending: Emergency Medicine | Admitting: Emergency Medicine

## 2014-10-22 DIAGNOSIS — Y9389 Activity, other specified: Secondary | ICD-10-CM | POA: Insufficient documentation

## 2014-10-22 DIAGNOSIS — Z8709 Personal history of other diseases of the respiratory system: Secondary | ICD-10-CM | POA: Diagnosis not present

## 2014-10-22 DIAGNOSIS — Y9289 Other specified places as the place of occurrence of the external cause: Secondary | ICD-10-CM | POA: Insufficient documentation

## 2014-10-22 DIAGNOSIS — G43909 Migraine, unspecified, not intractable, without status migrainosus: Secondary | ICD-10-CM | POA: Diagnosis not present

## 2014-10-22 DIAGNOSIS — Y998 Other external cause status: Secondary | ICD-10-CM | POA: Insufficient documentation

## 2014-10-22 DIAGNOSIS — X58XXXA Exposure to other specified factors, initial encounter: Secondary | ICD-10-CM | POA: Diagnosis not present

## 2014-10-22 DIAGNOSIS — T7840XA Allergy, unspecified, initial encounter: Secondary | ICD-10-CM | POA: Diagnosis not present

## 2014-10-22 DIAGNOSIS — Z79899 Other long term (current) drug therapy: Secondary | ICD-10-CM | POA: Diagnosis not present

## 2014-10-22 NOTE — ED Notes (Signed)
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 Pt having nausea, lip and face tingling and throat feels scratchy. Took 2 benadryl pta.

## 2014-10-22 NOTE — ED Notes (Signed)
Pt stable, ambulatory, states understanding of discharge instructions 

## 2014-10-22 NOTE — Discharge Instructions (Signed)
Allergies Take Benadryl 50 mg every 6-8 hours for the next 24 hours. Return to the emergency department if you have recurrence of signs of worsening allergy such as difficulty breathing, difficulty swallowing or severe rash. An allergy is an abnormal reaction to a substance by the body's defense system (immune system). Allergies can develop at any age. WHAT CAUSES ALLERGIES? An allergic reaction happens when the immune system mistakenly reacts to a normally harmless substance, called an allergen, as if it were harmful. The immune system releases antibodies to fight the substance. Antibodies eventually release a chemical called histamine into the bloodstream. The release of histamine is meant to protect the body from infection, but it also causes discomfort. An allergic reaction can be triggered by:  Eating an allergen.  Inhaling an allergen.  Touching an allergen. WHAT TYPES OF ALLERGIES ARE THERE? There are many types of allergies. Common types include:  Seasonal allergies. People with this type of allergy are usually allergic to substances that are only present during certain seasons, such as molds and pollens.  Food allergies.  Drug allergies.  Insect allergies.  Animal dander allergies. WHAT ARE SYMPTOMS OF ALLERGIES? Possible allergy symptoms include:  Swelling of the lips, face, tongue, mouth, or throat.  Sneezing, coughing, or wheezing.  Nasal congestion.  Tingling in the mouth.  Rash.  Itching.  Itchy, red, swollen areas of skin (hives).  Watery eyes.  Vomiting.  Diarrhea.  Dizziness.  Lightheadedness.  Fainting.  Trouble breathing or swallowing.  Chest tightness.  Rapid heartbeat. HOW ARE ALLERGIES DIAGNOSED? Allergies are diagnosed with a medical and family history and one or more of the following:  Skin tests.  Blood tests.  A food diary. A food diary is a record of all the foods and drinks you have in a day and of all the symptoms you  experience.  The results of an elimination diet. An elimination diet involves eliminating foods from your diet and then adding them back in one by one to find out if a certain food causes an allergic reaction. HOW ARE ALLERGIES TREATED? There is no cure for allergies, but allergic reactions can be treated with medicine. Severe reactions usually need to be treated at a hospital. HOW CAN REACTIONS BE PREVENTED? The best way to prevent an allergic reaction is by avoiding the substance you are allergic to. Allergy shots and medicines can also help prevent reactions in some cases. People with severe allergic reactions may be able to prevent a life-threatening reaction called anaphylaxis with a medicine given right after exposure to the allergen.   This information is not intended to replace advice given to you by your health care provider. Make sure you discuss any questions you have with your health care provider.   Document Released: 03/18/2002 Document Revised: 01/13/2014 Document Reviewed: 10/04/2013 Elsevier Interactive Patient Education [employer]2016 Elsevier Inc.

## 2014-10-22 NOTE — ED Provider Notes (Signed)
CSN: [id number]     Arrival date & time 10/22/14  1836 History   First MD Initiated Contact with Patient 10/22/14 1908     Chief Complaint  Patient presents with  . 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 She states there was Microsofta"sneaky mushroom" in a pizza that she ate this evening. After she ate it, she got tingling in her mouth or throat. The patient immediately took 50 mg of Benadryl. He states now she feels better. She did not develop any difficulty breathing. She did not develop any diffuse rash. She reports she does have an EpiPen on hand but has never needed to use it. She reports her reaction seems to gotten worse over time however it is never progressed to significant difficulty breathing or extensive rash. Past Medical History  Diagnosis Date  . Tension pneumothorax   . Premature baby   . Migraine    Past Surgical History  Procedure Laterality Date  . None    . Wisdom tooth extraction    . Tympanostomy tube placement    . Nexplanon       insertion 04-13-14   Family History  Problem Relation Age of Onset  . Diabetes Mother   . Hyperlipidemia Mother   . Hypertension Mother   . Other Mother     MENIERES  . Migraines Father   . Other Father     Brain Tumor  . Stroke Maternal Grandmother   . Stroke Maternal Grandfather   . Hypertension Maternal Grandfather   . Diabetes Paternal Uncle    Social History  Substance Use Topics  . Smoking status: Never Smoker   . Smokeless tobacco: Never Used  . Alcohol Use: No   OB History    Gravida Para Term Preterm AB TAB SAB Ectopic Multiple Living   0 0 0 0 0 0 0 0 0 0      Review of Systems 10 Systems reviewed and are negative for acute change except as noted in the HPI.   Allergies  Other; Ketorolac tromethamine; Mushroom extract complex; and Sumatriptan  Home Medications   Prior to  Admission medications   Medication Sig Start Date End Date Taking? Authorizing Provider  Butalbital-APAP-Caffeine (FIORICET) 50-300-40 MG CAPS Take 1 capsule by mouth once. 08/21/14   Anson FretAntonia B Ahern, MD  Diclofenac Potassium (CAMBIA) 50 MG PACK Take 50 mg by mouth.    Historical Provider, MD  EPINEPHrine 0.3 mg/0.3 mL IJ SOAJ injection Inject 0.3 mg into the skin. 08/18/14   Historical Provider, MD  escitalopram (LEXAPRO) 20 MG tablet Take 20 mg by mouth daily. 08/18/14 08/18/15  Historical Provider, MD  etonogestrel (NEXPLANON) 68 MG IMPL implant 1 each by Subdermal route once.    Historical Provider, MD  ibuprofen (ADVIL,MOTRIN) 600 MG tablet 1 tab by mouth every 8 hours as needed for pain 02/08/10   Historical Provider, MD  NAPROXEN PO Take by mouth as needed.    Historical Provider, MD  ondansetron (ZOFRAN-ODT) 4 MG disintegrating tablet Take 1 tablet (4 mg total) by mouth every 8 (eight) hours as needed for nausea. 08/21/14   Anson FretAntonia B Ahern, MD  predniSONE (DELTASONE) 10 MG tablet 6 day dose pack take as directed 10/12/14   Nilda RiggsNancy Carolyn Martin, NP  propranolol ER (INDERAL LA) 60 MG 24 hr capsule Take 1 capsule (60 mg total) by mouth daily. 08/21/14   Anson FretAntonia B Ahern, MD   BP  116/77 mmHg  Pulse 75  Temp(Src) 100.2 F (37.9 C) (Oral)  Resp 18  SpO2 98% Physical Exam  Constitutional: She is oriented to person, place, and time. She appears well-developed and well-nourished.  HENT:  Head: Normocephalic and atraumatic.  Nose: Nose normal.  Mouth/Throat: Oropharynx is clear and moist.  Eyes: EOM are normal. Pupils are equal, round, and reactive to light.  Neck: Neck supple.  Cardiovascular: Normal rate, regular rhythm, normal heart sounds and intact distal pulses.   Pulmonary/Chest: Effort normal and breath sounds normal.  Abdominal: Soft. Bowel sounds are normal. She exhibits no distension. There is no tenderness.  Musculoskeletal: Normal range of motion. She exhibits no edema.  Neurological:  She is alert and oriented to person, place, and time. She has normal strength. Coordination normal. GCS eye subscore is 4. GCS verbal subscore is 5. GCS motor subscore is 6.  Skin: Skin is warm, dry and intact. No rash noted.  Psychiatric: She has a normal mood and affect.    ED Course  Procedures (including critical care time) Labs Review Labs Reviewed - No data to display  Imaging Review No results found. I have personally reviewed and evaluated these images and lab results as part of my medical decision-making.   EKG Interpretation None      MDM   Final diagnoses:  Allergic reaction, initial encounter   At this time, the patient is clinically a symptomatically. She feels that the Benadryl has resolved her symptoms. She is instructed to continue Benadryl for the next 24 hours. She is instructed to return should she get any tightness in her throat or difficulty breathing. She does have an epinephrine pen that had been previously prescribed she however has never had an actual anaphylactic type reaction by her description.    Arby Barrette, MD 10/22/14 2000

## 2014-10-31 ENCOUNTER — Encounter: Payer: Self-pay | Admitting: Neurology

## 2014-10-31 ENCOUNTER — Ambulatory Visit (INDEPENDENT_AMBULATORY_CARE_PROVIDER_SITE_OTHER): Payer: BC Managed Care – PPO | Admitting: Neurology

## 2014-10-31 VITALS — BP 125/75 | HR 75 | Resp 20 | Ht 66.0 in | Wt 124.0 lb

## 2014-10-31 DIAGNOSIS — G43009 Migraine without aura, not intractable, without status migrainosus: Secondary | ICD-10-CM | POA: Diagnosis not present

## 2014-10-31 MED ORDER — NORTRIPTYLINE HCL 10 MG PO CAPS: 20.0000 mg | ORAL_CAPSULE | Freq: Every day | ORAL | Status: DC

## 2014-10-31 NOTE — Progress Notes (Signed)
[facility]   Interval history 10/31/2014: Migraines are worsening. One a week. She is using the cambia and fioricet(sparingly) for acute management (she declines triptans due to a family history of side effects). She is on the inderal. Will add nortriptyline at night which may help with her insomnia as well.  HISTORY OF PRESENT ILLNESS Lindsey Fitzgerald: Lindsey Fitzgerald, 19 year old female returns for follow-up. She began having a migraine Thursday, September 29 which lasted for 2 days and then it came back on Sunday Oct 2,2016 and has been persistent. She has not taken Cambia she has only been taking the Fioricet. She has been nauseated and takes Zofran she has had a lot of photophobia. 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 She has very few foods that are triggers and she has tried to avoid those these would include artificial sugar and MSG. She has also been taking a lot of ibuprofen and was cautioned against this due to rebound. She returns for reevaluation. Except for this last week of headaches her headache frequency is down from 4 times a week to 2 times a week. She continues to take Inderal LA as her preventive. She is afraid to use triptans as her father had cardiac arrest after using a triptan.  Dr. Lucia Gaskins Interval update 08/21/2014: Since the summer she has only had a few headaches. No side effects from the propranolol. Don't want to try a triptan due to bad reaction in her father. She has 1-2 smaller headaches every 1-2 weeks they last 2-3 hours and they can get a 4-5/10 just enough to be annoying. 4/month was 8/10. The cambia helps with acute management. She can have nausea with the migraines.   HPI: Lindsey Fitzgerald is a 19 y.o. female here as a follow up for migraines. She stopped the Topamax due to double vision. She is doing very well. Has had only 3 headaches. They lasted 2 hours or  longer. She tried to sleep through them. Imitrex stopped her dad's heart when he was a teenager so she is very hesitant to try triptans. Toradol makes her hyper. Has not tried reglan. She uses fioricet and cambia acutely.   The migraines started in 2nd grade. Father has migraines. Migraines start sometimes without an aura and the feel like general pain throughout head, at the back of the head, deep ehind eyes and something is burning a path from the back of her head to her eyes. Other times feels like someone is hitting her head with a hammer. She also gets pressure-type headaches. Headaches were 7-8/10 and would last from 30 minutes to days. Longest one was 4 days. Would have them 4-6 a week. +nausea. +phonophobia. Sleeping may help when it gets really bad. Only medication has really helped in the past. Triggers include weather changes such as bad thunderstorms, really cold or hot. She gets 8 hours of sleep. Is on regular sleep/wake cycle. MSG makes it worse as does artificial sugar. She was started on topamax  at night which has improved headaches.  caused hand paresthesias. Butterbur didn't help. Not sure if magnesium helped. Takes advil once a week for rescue. Doesn't take fioricet. No focal neurologic deficits during the headaches. Not sexually active. Not interested in having children anytime soon. She just started [employer]  Review of Systems: Patient complains of symptoms per HPI as well as the following symptoms: headache, insomia.  Pertinent negatives per HPI. All others negative.   Social History   Social History  . Marital Status: Single    Spouse Name: N/A  . Number of Children: 0  . Years of Education: 12   Occupational History  . Not on file.   Social History Main Topics  . Smoking status: Never Smoker   . Smokeless tobacco: Never Used  . Alcohol Use: No  . Drug Use: No  . Sexual Activity:    Partners: Male    Birth Control/ Protection: Implant     Comment: nexplanon    Other Topics Concern  . Not on file   Social History Narrative   Patient is single with no children.   Patient is right handed.   Patient has a high school education and currently in college.   Patient drinks caffeine occasionally.    Family History  Problem Relation Age of Onset  . Diabetes Mother   . Hyperlipidemia Mother   . Hypertension Mother   . Other Mother     MENIERES  . Migraines Father   . Other Father     Brain Tumor  . Stroke Maternal Grandmother   . Stroke Maternal Grandfather   . Hypertension Maternal Grandfather   . Diabetes Paternal Uncle     Past Medical History  Diagnosis Date  . Tension pneumothorax   . Premature baby   . Migraine     Past Surgical History  Procedure Laterality Date  . None    . Wisdom tooth extraction    . Tympanostomy tube placement    . Nexplanon       insertion 04-13-14    Current Outpatient Prescriptions  Medication Sig Dispense Refill  . Butalbital-APAP-Caffeine (FIORICET) 50-300-40 MG CAPS Take 1 capsule by mouth once. 8 capsule 11  . Diclofenac Potassium (CAMBIA) 50 MG PACK Take 50 mg by mouth.    . EPINEPHrine 0.3 mg/0.3 mL IJ SOAJ injection Inject 0.3 mg into the skin.    Marland Kitchen. escitalopram (LEXAPRO) 20 MG tablet Take 20 mg by mouth daily.    Marland Kitchen. etonogestrel (NEXPLANON) 68 MG IMPL implant 1 each by Subdermal route once.    Marland Kitchen. ibuprofen (ADVIL,MOTRIN) 600 MG tablet 1 tab by mouth every 8 hours as needed for pain    . NAPROXEN PO Take by mouth as needed.    . ondansetron (ZOFRAN-ODT) 4 MG disintegrating tablet Take 1 tablet (4 mg total) by mouth every 8 (eight) hours as needed for nausea. 30 tablet 11  . predniSONE (DELTASONE) 10 MG tablet 6 day dose pack take as directed 21 tablet 0  . propranolol ER (INDERAL LA) 60 MG 24 hr capsule Take 1 capsule (60 mg total) by mouth daily. 90 capsule 3   Current Facility-Administered Medications  Medication Dose Route Frequency Provider Last Rate Last Dose  . prochlorperazine  (COMPAZINE) injection 10 mg  10 mg Intravenous Once Anson FretAntonia B Luan Maberry, MD        Allergies as of 10/31/2014 - Review Complete 10/22/2014  Allergen Reaction Noted  . Other Swelling 08/21/2014  . Ketorolac tromethamine Other (See Comments) 08/26/2013  . Mushroom extract complex  10/22/2014  . Sumatriptan  08/26/2013    Vitals: There were no vitals taken for this visit. Last Weight:  Wt Readings from Last 1 Encounters:  10/12/14 132 lb 8 oz (60.102 kg)   Last Height:   Ht Readings from Last 1 Encounters:  10/12/14 5\' 5"  (1.651 m)    Physical exam: Exam:  Gen: NAD, conversant, well nourised, obese, well groomed                     Eyes: Conjunctivae clear without exudates or hemorrhage  Neuro: Detailed Neurologic Exam  Speech:    Speech is normal; fluent and spontaneous with normal comprehension.  Cognition:    The patient is oriented to person, place, and time;  Cranial Nerves:    The pupils are equal, round, and reactive to light. The fundi are normal and spontaneous venous pulsations are present. Visual fields are full to finger confrontation. Extraocular movements are intact. Trigeminal sensation is intact and the muscles of mastication are normal. The face is symmetric. The palate elevates in the midline. Hearing intact. Voice is normal. Shoulder shrug is normal. The tongue has normal motion without fasciculations.   Strength:    Strength is V/V in the upper and lower limbs.       ASSESSMENT AND PLAN  19 y.o. year old female has a past medical history of migraine which has lasted for a week.  PLAN: As far as your medications are concerned, I would like to suggest:  Continue Zofran at the onset of headache Limit use of ibuprofen as it can cause rebound headache, given information on rebound Continue propranolol ER as prescribed, can increase to  daily if needed Remember to drink plenty of fluid, eat healthy meals and do not skip any meals. Try to eat protein with a  every meal and eat a healthy snack such as fruit or nuts in between meals. Try to keep a regular sleep-wake schedule and try to exercise daily, particularly in the form of walking, 20-30 minutes a day, if you can.  Will add nortriptyline at night which may help with her insomnia as well. No cardiac problems.  Discussed side effects including teratogenicity, do not Pregnant on this medication and use birth control. Serious side effects can include hypotension, hypertension, syncope, ventricular arrhythmias, QT prolongation and other cardiac side effects, stroke and seizures, ataxia tardive dyskinesias, extrapyramidal symptoms, increased intraocular pressure, leukopenia, thrombocytopenia, hallucinations, suicidality and other serious side effects. Common reactions include drowsiness, dry mouth, dizziness, constipation, blurred vision, palpitations, tachycardia, impaired coordination, increased appetite, nausea vomiting, weakness, confusion, disorientation, restlessness, anxiety and other side effects.    Naomie Dean, MD  [facility] [address] [place], [place] [address]  Phone [phone] Fax [phone]  A total of 15 minutes was spent face-to-face with this patient. Over half this time was spent on counseling patient on the migraine diagnosis and different diagnostic and therapeutic options available.

## 2014-10-31 NOTE — Patient Instructions (Signed)
Overall you are doing fairly well but I do want to suggest a few things today:   Remember to drink plenty of fluid, eat healthy meals and do not skip any meals. Try to eat protein with a every meal and eat a healthy snack such as fruit or nuts in between meals. Try to keep a regular sleep-wake schedule and try to exercise daily, particularly in the form of walking, 20-30 minutes a day, if you can.   As far as your medications are concerned, I would like to suggest:  Start Nortriptyline 10mg  one hour before bed. If needed can increase to 20mg  (2 tabs) before bed.   I would like to see you back as needed, sooner if we need to. Please call us with any interim questions, concerns, problems, updates or refill requests.   Please also call us for any test results so we can go over those with you on the phone.  My clinical assistant and will answer any of your questions and relay your messages to me and also relay most of my messages to you.   Our phone number is [phone]. We also have an after hours call service for urgent matters and there is a physician on-call for urgent questions. For any emergencies you know to call 911 or go to the nearest emergency room

## 2015-02-08 ENCOUNTER — Telehealth: Payer: Self-pay | Admitting: Neurology

## 2015-02-08 NOTE — Telephone Encounter (Signed)
I returned call from patient's mother stating patient is having a bad migraine and had transient blurred vision for 5 minutes. She has already tried [place] and one tablet of Fioricet an hour ago  which has not helped.  I recommended patient take another tablet of Fioricet and try to go to sleep. If the headache is not significantly improved she needs to go to urgent care. Patient already has an appointment with Dr. Lucia Gaskins. tomorrow morning and she was advised to keep that

## 2015-02-09 ENCOUNTER — Ambulatory Visit (INDEPENDENT_AMBULATORY_CARE_PROVIDER_SITE_OTHER): Payer: BC Managed Care – PPO | Admitting: Neurology

## 2015-02-09 ENCOUNTER — Encounter: Payer: Self-pay | Admitting: Neurology

## 2015-02-09 VITALS — BP 120/79 | HR 112 | Ht 66.0 in | Wt 132.8 lb

## 2015-02-09 DIAGNOSIS — G43701 Chronic migraine without aura, not intractable, with status migrainosus: Secondary | ICD-10-CM | POA: Diagnosis not present

## 2015-02-09 MED ORDER — NORTRIPTYLINE HCL 50 MG PO CAPS: 50.0000 mg | ORAL_CAPSULE | Freq: Every day | ORAL | Status: DC

## 2015-02-09 NOTE — Progress Notes (Addendum)
[facility]    Provider:  Dr Lucia Gaskins Referring Provider: No ref. provider found Primary Care Physician:  No PCP Per Patient  Interval history 02/09/2015: Patient here for follow up of migraines. On Inderal and Nortriptyline. No medication overuse. Had double vision with topamax. Takes zofran and fioricet (sparingly) for acute management, due to family history of triptan side effects she declines them. Out of a month she has 20/30 headaches for at least 6 months. At least 10 are migraines a month. They start behind the left eye. Throbbing/pounding, they can be 7/10 in pain and last all day. She has to go into a dark room. Light and sound hurt, needs quiet and still. Can last up to 24 hours. +Nausea. Last night she had a severe headache.  This is making her stress out, no depression. Last semester was hard. She has already missed 3 classes because of this. This has been going on for at least 6 months or longer. No aura. She is a good condiadate for botox migraine therapy and will request. She woul dlike a migraine dog. Therapy and migraine dog.   The migraines started in 2nd grade. Father has migraines. Migraines start sometimes without an aura and the feel like general pain throughout head, at the back of the head, deep ehind eyes and something is burning a path from the back of her head to her eyes. Other times feels like someone is hitting her head with a hammer. She also gets pressure-type headaches. Headaches were 7-8/10 and would last from 30 minutes to days. Longest one was 4 days. Would have them 4-6 a week. +nausea. +phonophobia. Sleeping may help when it gets really bad. Only medication has really helped in the past. Triggers include weather changes such as bad thunderstorms, really cold or hot. She gets 8 hours of sleep. Is on regular sleep/wake cycle. MSG makes it worse as does artificial sugar. She was started on topamax 25mg  at night which has improved headaches. 50mg  caused  hand paresthesias. Butterbur didn't help. Not sure if magnesium helped.   Interval history 10/31/2014: Migraines are worsening. One a week that lasts 2-3 days. She is using the cambia and fioricet(sparingly) for acute management (she declines triptans due to a family history of side effects). She is on the inderal. Will add nortriptyline at night which may help with her insomnia as well.  HISTORY OF PRESENT ILLNESS Lindsey Fitzgerald: Lindsey Fitzgerald, 20 year old female returns for follow-up. She began having a migraine Thursday, September 29 which lasted for 2 days and then it came back on Sunday Oct 2,2016 and has been persistent. She has not taken Cambia she has only been taking the Fioricet. She has been nauseated and takes Zofran she has had a lot of photophobia. 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 She has very few foods that are triggers and she has tried to avoid those these would include artificial sugar and MSG. She has also been taking a lot of ibuprofen and was cautioned against this due to rebound. She returns for reevaluation. Except for this last week of headaches her headache frequency is down from 4 times a week to 2 times a week. She continues to take Inderal LA as her preventive. She is afraid to use triptans as her father had cardiac arrest after using a triptan.  Dr. Lucia Gaskins Interval update 08/21/2014: Since the summer she  has only had a few headaches. No side effects from the propranolol. Don't want to try a triptan due to bad reaction in her father. She has 1-2 smaller headaches every 1-2 weeks they last 2-3 hours and they can get a 4-5/10 just enough to be annoying. 4/month was 8/10. The cambia helps with acute management. She can have nausea with the migraines.   HPI: Lindsey Fitzgerald is a 20 y.o. female here as a follow up for migraines. She stopped the Topamax due to double vision. She is doing very  well. Has had only 3 headaches. They lasted 2 hours or longer. She tried to sleep through them. Imitrex stopped her dad's heart when he was a teenager so she is very hesitant to try triptans. Toradol makes her hyper. Has not tried reglan. She uses fioricet and cambia acutely.   The migraines started in 2nd grade. Father has migraines. Migraines start sometimes without an aura and the feel like general pain throughout head, at the back of the head, deep ehind eyes and something is burning a path from the back of her head to her eyes. Other times feels like someone is hitting her head with a hammer. She also gets pressure-type headaches. Headaches were 7-8/10 and would last from 30 minutes to days. Longest one was 4 days. Would have them 4-6 a week. +nausea. +phonophobia. Sleeping may help when it gets really bad. Only medication has really helped in the past. Triggers include weather changes such as bad thunderstorms, really cold or hot. She gets 8 hours of sleep. Is on regular sleep/wake cycle. MSG makes it worse as does artificial sugar. She was started on topamax 25mg  at night which has improved headaches. 50mg  caused hand paresthesias. Butterbur didn't help. Not sure if magnesium helped. Takes advil once a week for rescue. Doesn't take fioricet. No focal neurologic deficits during the headaches. Not sexually active. Not interested in having children anytime soon. She just started [employer]  Review of Systems: Patient complains of symptoms per HPI as well as the following symptoms: headache, insomia. Pertinent negatives per HPI. All others negative.    Social History   Social History  . Marital Status: Single    Spouse Name: N/A  . Number of Children: 0  . Years of Education: 12   Occupational History  . Not on file.   Social History Main Topics  . Smoking status: Never Smoker   . Smokeless tobacco: Never Used  . Alcohol Use: No  . Drug Use: No  . Sexual Activity:    Partners: Male    Birth  Control/ Protection: Implant     Comment: nexplanon   Other Topics Concern  . Not on file   Social History Narrative   Patient is single with no children.   Patient is right handed.   Patient has a high school education and currently in college.   Patient drinks caffeine occasionally.    Family History  Problem Relation Age of Onset  . Diabetes Mother   . Hyperlipidemia Mother   . Hypertension Mother   . Other Mother     MENIERES  . Migraines Father   . Other Father     Brain Tumor  . Stroke Maternal Grandmother   . Stroke Maternal Grandfather   . Hypertension Maternal Grandfather   . Diabetes Paternal Uncle     Past Medical History  Diagnosis Date  . Tension pneumothorax   . Premature baby   . Migraine  Past Surgical History  Procedure Laterality Date  . None    . Wisdom tooth extraction    . Tympanostomy tube placement    . Nexplanon       insertion 04-13-14    Current Outpatient Prescriptions  Medication Sig Dispense Refill  . Butalbital-APAP-Caffeine (FIORICET) 50-300-40 MG CAPS Take 1 capsule by mouth once. 8 capsule 11  . Diclofenac Potassium (CAMBIA) 50 MG PACK Take 50 mg by mouth.    . EPINEPHrine 0.3 mg/0.3 mL IJ SOAJ injection Inject 0.3 mg into the skin.    Marland Kitchen escitalopram (LEXAPRO) 20 MG tablet Take 20 mg by mouth daily.    Marland Kitchen etonogestrel (NEXPLANON) 68 MG IMPL implant 1 each by Subdermal route once.    Marland Kitchen ibuprofen (ADVIL,MOTRIN) 600 MG tablet 1 tab by mouth every 8 hours as needed for pain    . NAPROXEN PO Take by mouth as needed.    . nortriptyline (PAMELOR) 10 MG capsule Take 2 capsules (20 mg total) by mouth at bedtime. 60 capsule 6  . ondansetron (ZOFRAN-ODT) 4 MG disintegrating tablet Take 1 tablet (4 mg total) by mouth every 8 (eight) hours as needed for nausea. 30 tablet 11  . [DISCONTINUED] propranolol ER (INDERAL LA) 60 MG 24 hr capsule Take 1 capsule (60 mg total) by mouth daily. 90 capsule 3   Current Facility-Administered Medications    Medication Dose Route Frequency Provider Last Rate Last Dose  . prochlorperazine (COMPAZINE) injection 10 mg  10 mg Intravenous Once Anson Fret, MD        Allergies as of 02/09/2015 - Review Complete 10/31/2014  Allergen Reaction Noted  . Other Swelling 08/21/2014  . Ketorolac tromethamine Other (See Comments) 08/26/2013  . Mushroom extract complex  10/22/2014  . Sumatriptan  08/26/2013    Vitals: There were no vitals taken for this visit. Last Weight:  Wt Readings from Last 1 Encounters:  10/31/14 124 lb (56.246 kg)   Last Height:   Ht Readings from Last 1 Encounters:  10/31/14  (1.676 m)    Exam: Gen: NAD, conversant, well nourised, obese, well groomed  Eyes: Conjunctivae clear without exudates or hemorrhage  Neuro: Detailed Neurologic Exam  Speech:  Speech is normal; fluent and spontaneous with normal comprehension.  Cognition:  The patient is oriented to person, place, and time;  Cranial Nerves:  The pupils are equal, round, and reactive to light. The fundi are normal and spontaneous venous pulsations are present. Visual fields are full to finger confrontation. Extraocular movements are intact. Trigeminal sensation is intact and the muscles of mastication are normal. The face is symmetric. The palate elevates in the midline. Hearing intact. Voice is normal. Shoulder shrug is normal. The tongue has normal motion without fasciculations.   Strength:  Strength is V/V in the upper and lower limbs.     ASSESSMENT AND PLAN  20 y.o. year old femalewith chronic migraines w/o aura with status migrainosus not intractable  PLAN: As far as your medications are concerned, I would like to suggest:  Will request Botox for migraines Continue Zofran at the onset of headache Limit use of ibuprofen and fioricet as it can cause rebound headache, given information on rebound Continue propranolol ER as prescribed and increase  Nortriptyline Remember to drink plenty of fluid, eat healthy meals and do not skip any meals. Try to eat protein with a every meal and eat a healthy snack such as fruit or nuts in between meals. Try to keep a regular sleep-wake  schedule and try to exercise daily, particularly in the form of walking, 20-30 minutes a day, if you can.   Addendum 06/14/2015: Topamax with double vision, still on propranolol with nortriptyline (not working still with 20/30 headache days a month and superimposed migraines at least 15 a month), she is visiting the ED regularly, she never got the Sprix (will follow up), needs refills on cambia which helps, reglan helps a little bit, zofran heps with nausea, stopped refilling fioricet, fioricet didn't work, lexapro did not work, steroids, verapamil. Will increase Inderal and start Cymbalta.  Will increase nortriptyline at night which may help with her insomnia as well. No cardiac problems. Discussed side effects including teratogenicity, do not Pregnant on this medication and use birth control. Serious side effects can include hypotension, hypertension, syncope, ventricular arrhythmias, QT prolongation and other cardiac side effects, stroke and seizures, ataxia tardive dyskinesias, extrapyramidal symptoms, increased intraocular pressure, leukopenia, thrombocytopenia, hallucinations, suicidality and other serious side effects. Common reactions include drowsiness, dry mouth, dizziness, constipation, blurred vision, palpitations, tachycardia, impaired coordination, increased appetite, nausea vomiting, weakness, confusion, disorientation, restlessness, anxiety and other side effects.  To prevent or relieve headaches, try the following: Cool Compress. Lie down and place a cool compress on your head.  Avoid headache triggers. If certain foods or odors seem to have triggered your migraines in the past, avoid them. A headache diary might help you identify triggers.  Include physical  activity in your daily routine. Try a daily walk or other moderate aerobic exercise.  Manage stress. Find healthy ways to cope with the stressors, such as delegating tasks on your to-do list.  Practice relaxation techniques. Try deep breathing, yoga, massage and visualization.  Eat regularly. Eating regularly scheduled meals and maintaining a healthy diet might help prevent headaches. Also, drink plenty of fluids.  Follow a regular sleep schedule. Sleep deprivation might contribute to headaches Consider biofeedback. With this mind-body technique, you learn to control certain bodily functions - such as muscle tension, heart rate and blood pressure - to prevent headaches or reduce headache pain.    Proceed to emergency room if you experience new or worsening symptoms or symptoms do not resolve, if you have new neurologic symptoms or if headache is severe, or for any concerning symptom.    Naomie Dean, MD  [facility] [address] [place], [place] [address]  Phone [phone] Fax [phone]  A total of 30 minutes was spent face-to-face with this patient. Over half this time was spent on counseling patient on the migraine diagnosis and different diagnostic and therapeutic options available.

## 2015-02-09 NOTE — Patient Instructions (Signed)
Remember to drink plenty of fluid, eat healthy meals and do not skip any meals. Try to eat protein with a every meal and eat a healthy snack such as fruit or nuts in between meals. Try to keep a regular sleep-wake schedule and try to exercise daily, particularly in the form of walking, 20-30 minutes a day, if you can.   As far as your medications are concerned, I would like to suggest: Continue Inderal Increase Nortriptyline to  at night  I would like to see you back for botox, sooner if we need to. Please call us with any interim questions, concerns, problems, updates or refill requests.   Our phone number is [phone]. We also have an after hours call service for urgent matters and there is a physician on-call for urgent questions. For any emergencies you know to call 911 or go to the nearest emergency room

## 2015-02-12 DIAGNOSIS — G43701 Chronic migraine without aura, not intractable, with status migrainosus: Secondary | ICD-10-CM | POA: Insufficient documentation

## 2015-02-22 ENCOUNTER — Telehealth: Payer: Self-pay | Admitting: Neurology

## 2015-02-22 NOTE — Telephone Encounter (Signed)
Patient needs therapy dog

## 2015-02-27 ENCOUNTER — Telehealth: Payer: Self-pay | Admitting: Neurology

## 2015-02-27 NOTE — Telephone Encounter (Signed)
Need note for her therapy dog.

## 2015-02-28 ENCOUNTER — Telehealth: Payer: Self-pay | Admitting: Neurology

## 2015-02-28 NOTE — Telephone Encounter (Signed)
Spoke with the pharmacy who stated that the patients account had been canceled due to not being able to reach the patient after numerous attempts. Initiated the account again and will proceed with PA.

## 2015-03-01 NOTE — Telephone Encounter (Signed)
Spoke with the patient and advised her to call the pharmacy.

## 2015-03-05 ENCOUNTER — Encounter (HOSPITAL_COMMUNITY): Payer: Self-pay | Admitting: *Deleted

## 2015-03-05 ENCOUNTER — Telehealth: Payer: Self-pay | Admitting: Neurology

## 2015-03-05 ENCOUNTER — Emergency Department (HOSPITAL_COMMUNITY): Payer: BC Managed Care – PPO

## 2015-03-05 ENCOUNTER — Emergency Department (HOSPITAL_COMMUNITY)
Admission: EM | Admit: 2015-03-05 | Discharge: 2015-03-05 | Disposition: A | Discharge status: Home / Self Care | Payer: BC Managed Care – PPO | Attending: Emergency Medicine | Admitting: Emergency Medicine

## 2015-03-05 DIAGNOSIS — Z8709 Personal history of other diseases of the respiratory system: Secondary | ICD-10-CM | POA: Insufficient documentation

## 2015-03-05 DIAGNOSIS — Z79899 Other long term (current) drug therapy: Secondary | ICD-10-CM | POA: Insufficient documentation

## 2015-03-05 DIAGNOSIS — G43809 Other migraine, not intractable, without status migrainosus: Secondary | ICD-10-CM | POA: Insufficient documentation

## 2015-03-05 DIAGNOSIS — R51 Headache: Secondary | ICD-10-CM | POA: Diagnosis present

## 2015-03-05 MED ORDER — OXYCODONE-ACETAMINOPHEN 5-325 MG PO TABS
1.0000 | ORAL_TABLET | Freq: Once | ORAL | Status: AC
  Administered 2015-03-05: 1 via ORAL

## 2015-03-05 MED ORDER — DEXAMETHASONE SODIUM PHOSPHATE 10 MG/ML IJ SOLN
10.0000 mg | Freq: Once | INTRAMUSCULAR | Status: AC
  Administered 2015-03-05: 10 mg via INTRAVENOUS
  Filled 2015-03-05: qty 1

## 2015-03-05 MED ORDER — SODIUM CHLORIDE 0.9 % IV BOLUS (SEPSIS)
1000.0000 mL | Freq: Once | INTRAVENOUS | Status: AC
  Administered 2015-03-05: 1000 mL via INTRAVENOUS

## 2015-03-05 MED ORDER — MORPHINE SULFATE (PF) 4 MG/ML IV SOLN
4.0000 mg | Freq: Once | INTRAVENOUS | Status: AC
  Administered 2015-03-05: 4 mg via INTRAVENOUS
  Filled 2015-03-05: qty 1

## 2015-03-05 MED ORDER — KETOROLAC TROMETHAMINE 15 MG/ML IJ SOLN
30.0000 mg | Freq: Once | INTRAMUSCULAR | Status: AC
  Administered 2015-03-05: 30 mg via INTRAVENOUS
  Filled 2015-03-05: qty 2

## 2015-03-05 MED ORDER — OXYCODONE-ACETAMINOPHEN 5-325 MG PO TABS
ORAL_TABLET | ORAL | Status: AC
  Filled 2015-03-05: qty 1

## 2015-03-05 MED ORDER — ONDANSETRON HCL 4 MG/2ML IJ SOLN
4.0000 mg | Freq: Once | INTRAMUSCULAR | Status: AC
  Administered 2015-03-05: 4 mg via INTRAVENOUS
  Filled 2015-03-05: qty 2

## 2015-03-05 NOTE — Telephone Encounter (Signed)
Mother returned Lindsey Fitzgerald's call, appointment scheduled for tomorrow 03/06/15 with NP, Darrol Angel.

## 2015-03-05 NOTE — ED Notes (Signed)
Pt reports having hx of migraines. Having severe headache for several days, reports n/v and sensitivity to light. No relief with migraine meds.

## 2015-03-05 NOTE — Telephone Encounter (Signed)
LVM returning call from Toniann Fail (mother). Gave [facility] phone number. Please offer f/u with NP if she calls back for tomorrow.

## 2015-03-05 NOTE — ED Provider Notes (Signed)
Migraine since yesterday - typical headache Migraine meds at home without relief MRI in DEC negative  Has received Morphine, Toradol, Zofran  Plan: recheck after meds Head CT ordered for severely worsening pain - Negative for acute findings. Results discussed by previous treatment team.  Re-eval: the patient reports headache is improving. No nausea. She can be discharged home per plan of previous treatment team. She has an appointment with her headache doctor tomorrow. Mom and patient comfortable with discharge plan.    Elpidio Anis, PA-C 03/05/15 2142  Pricilla Loveless, MD 03/06/15 [phone]

## 2015-03-05 NOTE — Telephone Encounter (Signed)
Patient's mother is calling and wanted to let you know her daughter is in the ER with a severe migraine.  She has been given percocet for pain.  Thanks!

## 2015-03-05 NOTE — ED Notes (Signed)
Went to answer call light and pt crying in pain,sts pain just spiked, spoke with Trey Paula pa and informed to give zofran, went to bedside to give zofran and visitor at bedside states that it doesn't do anything for pain and that she was supposed to already have gotten zofran.

## 2015-03-05 NOTE — ED Notes (Signed)
Pt ambulates independently and with steady gait at time of discharge. Discharge instructions and follow up information reviewed with patient. No other questions or concerns voiced at this time.  

## 2015-03-05 NOTE — ED Notes (Signed)
Patient transported to CT 

## 2015-03-05 NOTE — ED Notes (Signed)
Returned from ct. Calmer now but reports that it is no better than before

## 2015-03-05 NOTE — Discharge Instructions (Signed)

## 2015-03-05 NOTE — ED Provider Notes (Signed)
CSN: [id number]     Arrival date & time 03/05/15  1252 History   First MD Initiated Contact with Patient 03/05/15 1731     Chief Complaint  Patient presents with  . Migraine    HPI   20 year old female presents today with complaints of migraine. Patient has a history of chronic migraines diagnosed in third grade. Patient reports she's currently seeing neurology for these, and has a follow-up appointment tomorrow. She reports symptoms started yesterday slowly with a typical migraine headache that resolved on its own. She reports eating a friend's beef jerky with a return of the headache that has been persistent since that time. Patient reports this is identical to previous migraines, described as "smashing" with light and sound sensitivity. Patient denies any focal neurological deficits, reports a MRI 4 months ago with no significant findings. Patient states she attempted her at home treatments which include 1000 mg of ibuprofen, 50 mg of Ambien, and Fioricet. She reports she took those medications approximately 6:30 last night and then again at 9 AM this morning; these medications did not provide significant relief of her symptoms. Patient denies any specific triggers other than pizza, but is uncertain if of the things may be triggering her migraines. She denies any fever, chills, neck stiffness, focal neurological deficits, trauma to the head or neck, or any other headache red flags.    Past Medical History  Diagnosis Date  . Tension pneumothorax   . Premature baby   . Migraine    Past Surgical History  Procedure Laterality Date  . None    . Wisdom tooth extraction    . Tympanostomy tube placement    . Nexplanon       insertion 04-13-14   Family History  Problem Relation Age of Onset  . Diabetes Mother   . Hyperlipidemia Mother   . Hypertension Mother   . Other Mother     MENIERES  . Migraines Father   . Other Father     Brain Tumor  . Stroke Maternal Grandmother   . Stroke  Maternal Grandfather   . Hypertension Maternal Grandfather   . Diabetes Paternal Uncle    Social History  Substance Use Topics  . Smoking status: Never Smoker   . Smokeless tobacco: Never Used  . Alcohol Use: No   OB History    Gravida Para Term Preterm AB TAB SAB Ectopic Multiple Living       Review of Systems  All other systems reviewed and are negative.   Allergies  Other; Ketorolac tromethamine; Mushroom extract complex; Sumatriptan; and Asparagus  Home Medications   Prior to Admission medications   Medication Sig Start Date End Date Taking? Authorizing Provider  Butalbital-APAP-Caffeine (FIORICET) 50-300-40 MG CAPS Take 1 capsule by mouth once. Patient taking differently: Take 1 capsule by mouth daily as needed (migraine).  08/21/14  Yes Anson Fret, MD  Diclofenac Potassium (CAMBIA) 50 MG PACK Take 50 mg by mouth daily as needed (migraine).    Yes Historical Provider, MD  EPINEPHrine 0.3 mg/0.3 mL IJ SOAJ injection Inject 0.3 mg into the skin once.  08/18/14  Yes Historical Provider, MD  escitalopram (LEXAPRO) 20 MG tablet Take 20 mg by mouth at bedtime.  08/18/14 08/18/15 Yes Historical Provider, MD  etonogestrel (NEXPLANON) 68 MG IMPL implant 1 each by Subdermal route [id number] Historical Provider, MD  glycopyrrolate (ROBINUL) 1 MG tablet Take 2 mg by mouth  2 (two) times daily.  01/20/15  Yes Historical Provider, MD  ibuprofen (ADVIL,MOTRIN) 200 MG tablet Take 200 mg by mouth every 6 (six) hours as needed for headache, mild pain or moderate pain.   Yes Historical Provider, MD  NAPROXEN PO Take 220 mg by mouth as needed (headache).    Yes Historical Provider, MD  nortriptyline (PAMELOR) 50 MG capsule Take 1 capsule (50 mg total) by mouth at bedtime. 02/09/15  Yes Anson Fret, MD  ondansetron (ZOFRAN-ODT) 4 MG disintegrating tablet Take 1 tablet (4 mg total) by mouth every 8 (eight) hours as needed for nausea. 08/21/14  Yes Anson Fret, MD   oxyCODONE-acetaminophen (PERCOCET/ROXICET) 5-325 MG tablet Take 1-2 tablets by mouth every 4 (four) hours as needed for moderate pain.  01/01/15  Yes Historical Provider, MD   BP 104/90 mmHg  Pulse 130  Temp(Src) 98.2 F (36.8 C) (Oral)  Resp 22  SpO2 98%  LMP 03/05/2015   Physical Exam  Constitutional: She is oriented to person, place, and time. She appears well-developed and well-nourished.  HENT:  Head: Normocephalic and atraumatic.  Eyes: Conjunctivae are normal. Pupils are equal, round, and reactive to light. Right eye exhibits no discharge. Left eye exhibits no discharge. No scleral icterus.  Neck: Normal range of motion. No JVD present. No tracheal deviation present.  Pulmonary/Chest: Effort normal. No stridor.  Neurological: She is alert and oriented to person, place, and time. No cranial nerve deficit or sensory deficit. Coordination normal. GCS eye subscore is 4. GCS verbal subscore is 5. GCS motor subscore is 6.  Reflex Scores:      Patellar reflexes are 2+ on the right side and 2+ on the left side. Skin: Skin is warm and dry. No rash noted. No erythema. No pallor.  Psychiatric: She has a normal mood and affect. Her behavior is normal. Judgment and thought content normal.  Nursing note and vitals reviewed.   ED Course  Procedures (including critical care time) Labs Review Labs Reviewed - No data to display  Imaging Review Ct Head Wo Contrast  03/05/2015  CLINICAL DATA:  Severe headache for several days with nausea and vomiting and photophobia. History of migraines. EXAM: CT HEAD WITHOUT CONTRAST TECHNIQUE: Contiguous axial images were obtained from the base of the skull through the vertex without intravenous contrast. COMPARISON:  None. FINDINGS: The ventricles are normal in size and configuration. There are no parenchymal masses or mass effect, no evidence of an infarct, no extra-axial masses or abnormal fluid collections and no intracranial hemorrhage. No skull lesion.  Mucous retention cyst in left maxillary sinus. Minor mucosal thickening in the right ethmoid air cells and left frontal sinus. Clear mastoid air cells. IMPRESSION: 1. No intracranial abnormality. Electronically Signed   By: Amie Portland M.D.   On: 03/05/2015 19:59   I have personally reviewed and evaluated these images and lab results as part of my medical decision-making.   EKG Interpretation None      MDM   Final diagnoses:  Other migraine without status migrainosus, not intractable    Labs:   Imaging:CT head without  Consults:  Therapeutics: Decadron, Toradol, Zofran, normal saline, morphine, Percocet  Discharge Meds:   Assessment/Plan:20 year old female presents today with migraine. This is typical of previous migraines, she has no red flags here. Patient reports that normal migraine cocktail including Toradol and Benadryl cause adverse side effects Including "increased energy". Patient has a follow-up with her neurologist tomorrow morning. Patient was given morphine here in the  ED which significantly improved her symptoms. After reevaluating the patient was called back to the room is patient had acute worsening of her symptoms. Patient was given Toradol, this was approved by her mother, CT scan performed. Patient recheck after CT scan shows improvement in headache, Decadron ordered at this time, CT scan results discussed with patient including negative findings. Throughout my evaluation patient was cooperative and inquisitive, asking me numerous questions, with the exception of the one episode of acute pain, patient did not appear to be in significant distress. Due to patient's continuation of symptoms at the time of my shift change, care was transferred over to Elpidio Anis [facility] pending repeat evaluation and further management.        Eyvonne Mechanic, PA-C 03/05/15 2017  Pricilla Loveless, MD 03/06/15 [phone]

## 2015-03-06 ENCOUNTER — Encounter: Payer: Self-pay | Admitting: Nurse Practitioner

## 2015-03-06 ENCOUNTER — Ambulatory Visit (INDEPENDENT_AMBULATORY_CARE_PROVIDER_SITE_OTHER): Payer: BC Managed Care – PPO | Admitting: Nurse Practitioner

## 2015-03-06 ENCOUNTER — Telehealth: Payer: Self-pay | Admitting: Neurology

## 2015-03-06 ENCOUNTER — Encounter: Payer: Self-pay | Admitting: Neurology

## 2015-03-06 VITALS — BP 134/84 | HR 101 | Ht 66.0 in | Wt 134.8 lb

## 2015-03-06 DIAGNOSIS — G43909 Migraine, unspecified, not intractable, without status migrainosus: Secondary | ICD-10-CM

## 2015-03-06 DIAGNOSIS — G43701 Chronic migraine without aura, not intractable, with status migrainosus: Secondary | ICD-10-CM

## 2015-03-06 MED ORDER — PREDNISONE 10 MG PO TABS: ORAL_TABLET | ORAL | Status: DC

## 2015-03-06 NOTE — Telephone Encounter (Signed)
Called [facility] back and spoke to [place] at [phone]. And Botox will be delivered before lunch 03/07/2015.  Called and spoke to patient's mother she is aware . Of details Botox will arrive 03/07/2015 before lunch.

## 2015-03-06 NOTE — Telephone Encounter (Signed)
Lindsey Fitzgerald with [facility] [phone] called reg shipment for botox.

## 2015-03-06 NOTE — Telephone Encounter (Addendum)
Kathlene November with [facility] -[phone] ext [phone] is calling to get the ICD-10 code for the patient's medication to be filled.  Please call.

## 2015-03-06 NOTE — Telephone Encounter (Signed)
Lindsey Fitzgerald, I wrote the letter for patient. Would you print it out and have me sign it and get it to [place] at her appointment? thanks

## 2015-03-06 NOTE — Progress Notes (Addendum)
[facility]  PATIENT: Lindsey Fitzgerald DOB: [birth year]   REASON FOR VISIT: Follow-up for migraine  HISTORY FROM: Patient mom and dad    HISTORY OF PRESENT ILLNESS:UPDATE 2/28/17[name], 20 year old female returns for follow-up after being seen in the emergency room yesterday for migraine. She was given Decadron and Toradol morphine Zofran Percocet and 1000 mL of normal saline. She went home and went to sleep but woke up with a headache this morning which is a 7-8 on the pain scale. She is nauseated and vomiting. She says she is trying to keep fluids down mostly Gatorade. She is continuing with her nortriptyline. She has stopped her Inderal. She is afraid to take triptans. She is in college and is having difficulty taking her 15 hours due to missing classes. She may be a candidate for Botox. She returns for reevaluation   Interval history 02/09/2015: Patient here for follow up of migraines. On Inderal and Nortriptyline. No medication overuse. Had double vision with topamax. Takes zofran and fioricet (sparingly) for acute management, due to family history of triptan side effects she declines them. Out of a month she has 20/30 headaches for at least 6 months. At least 10 are migraines a month. They start behind the left eye. Throbbing/pounding, they can be 7/10 in pain and last all day. She has to go into a dark room. Light and sound hurt, needs quiet and still. Can last up to 24 hours. +Nausea. Last night she had a severe headache. This is making her stress out, no depression. Last semester was hard. She has already missed 3 classes because of this. This has been going on for at least 6 months or longer. No aura. She is a good condiadate for botox migraine therapy and will request. She woul dlike a migraine dog. Therapy and migraine dog.   The migraines started in 2nd grade. Father has migraines. Migraines start sometimes without an aura and the feel like general pain  throughout head, at the back of the head, deep ehind eyes and something is burning a path from the back of her head to her eyes. Other times feels like someone is hitting her head with a hammer. She also gets pressure-type headaches. Headaches were 7-8/10 and would last from 30 minutes to days. Longest one was 4 days. Would have them 4-6 a week. +nausea. +phonophobia. Sleeping may help when it gets really bad. Only medication has really helped in the past. Triggers include weather changes such as bad thunderstorms, really cold or hot. She gets 8 hours of sleep. Is on regular sleep/wake cycle. MSG makes it worse as does artificial sugar. She was started on topamax 25mg  at night which has improved headaches. 50mg  caused hand paresthesias. Butterbur didn't help. Not sure if magnesium helped.   Interval history 10/31/2014: Migraines are worsening. One a week that lasts 2-3 days. She is using the cambia and fioricet(sparingly) for acute management (she declines triptans due to a family history of side effects). She is on the inderal. Will add nortriptyline at night which may help with her insomnia as well.  HISTORY OF PRESENT ILLNESS Lindsey Fitzgerald: Lindsey Fitzgerald, 20 year old female returns for follow-up. She began having a migraine Thursday, September 29 which lasted for 2 days and then it came back on Sunday Oct 2,2016 and has been persistent. She has not taken Cambia she has only been taking the Fioricet. She has been nauseated and takes Zofran she has had a lot of photophobia. She is a  student at [employer] she is getting 8 hours of sleep every night as lack of sleep is a migraine trigger for her. She has very few foods that are triggers and she has tried to avoid those these would include artificial sugar and MSG. She has also been taking a lot of ibuprofen and was cautioned against this due to rebound. She returns for reevaluation. Except for this last week of headaches her headache frequency is down from 4  times a week to 2 times a week. She continues to take Inderal LA as her preventive. She is afraid to use triptans as her father had cardiac arrest after using a triptan.  Dr. Lucia Gaskins Interval update 08/21/2014: Since the summer she has only had a few headaches. No side effects from the propranolol. Don't want to try a triptan due to bad reaction in her father. She has 1-2 smaller headaches every 1-2 weeks they last 2-3 hours and they can get a 4-5/10 just enough to be annoying. 4/month was 8/10. The cambia helps with acute management. She can have nausea with the migraines.     REVIEW OF SYSTEMS: Full 14 system review of systems performed and notable only for those listed, all others are neg:  Constitutional: neg  Cardiovascular: neg Ear/Nose/Throat: neg  Skin: neg Eyes: Light sensitivity, blurred vision Respiratory: neg Gastroitestinal: Nausea and vomiting Hematology/Lymphatic: neg  Endocrine: neg Musculoskeletal:neg Allergy/Immunology: neg Neurological: Dizziness headache Psychiatric: neg Sleep : Insomnia   ALLERGIES: Allergies  Allergen Reactions  . Other Swelling    Silk tape  Tongue swelling  . Ketorolac Tromethamine Other (See Comments)    INSOMNIA  . Mushroom Extract Complex   . Sumatriptan   . Asparagus Other (See Comments)    Sneezing for approximately 2 hrs.    HOME MEDICATIONS: Outpatient Prescriptions Prior to Visit  Medication Sig Dispense Refill  . Butalbital-APAP-Caffeine (FIORICET) 50-300-40 MG CAPS Take 1 capsule by mouth once. (Patient taking differently: Take 1 capsule by mouth daily as needed (migraine). ) 8 capsule 11  . Diclofenac Potassium (CAMBIA) 50 MG PACK Take 50 mg by mouth daily as needed (migraine).     Marland Kitchen EPINEPHrine 0.3 mg/0.3 mL IJ SOAJ injection Inject 0.3 mg into the skin once.     . escitalopram (LEXAPRO) 20 MG tablet Take 20 mg by mouth at bedtime.     Marland Kitchen etonogestrel (NEXPLANON) 68 MG IMPL implant 1 each by Subdermal route once.    Marland Kitchen  glycopyrrolate (ROBINUL) 1 MG tablet Take 2 mg by mouth 2 (two) times daily.   5  . ibuprofen (ADVIL,MOTRIN) 200 MG tablet Take 200 mg by mouth every 6 (six) hours as needed for headache, mild pain or moderate pain.    Marland Kitchen NAPROXEN PO Take 220 mg by mouth as needed (headache).     . nortriptyline (PAMELOR) 50 MG capsule Take 1 capsule (50 mg total) by mouth at bedtime. 30 capsule 11  . ondansetron (ZOFRAN-ODT) 4 MG disintegrating tablet Take 1 tablet (4 mg total) by mouth every 8 (eight) hours as needed for nausea. 30 tablet 11  . oxyCODONE-acetaminophen (PERCOCET/ROXICET) 5-325 MG tablet Take 1-2 tablets by mouth every 4 (four) hours as needed for moderate pain. Reported on 03/06/2015  0   Facility-Administered Medications Prior to Visit  Medication Dose Route Frequency Provider Last Rate Last Dose  . prochlorperazine (COMPAZINE) injection 10 mg  10 mg Intravenous Once Anson Fret, MD        PAST MEDICAL HISTORY: Past  Medical History  Diagnosis Date  . Tension pneumothorax   . Premature baby   . Migraine     PAST SURGICAL HISTORY: Past Surgical History  Procedure Laterality Date  . None    . Wisdom tooth extraction    . Tympanostomy tube placement    . Nexplanon       insertion 04-13-14    FAMILY HISTORY: Family History  Problem Relation Age of Onset  . Diabetes Mother   . Hyperlipidemia Mother   . Hypertension Mother   . Other Mother     MENIERES  . Migraines Father   . Other Father     Brain Tumor  . Stroke Maternal Grandmother   . Stroke Maternal Grandfather   . Hypertension Maternal Grandfather   . Diabetes Paternal Uncle     SOCIAL HISTORY: Social History   Social History  . Marital Status: Single    Spouse Name: N/A  . Number of Children: 0  . Years of Education: 12   Occupational History  . Not on file.   Social History Main Topics  . Smoking status: Never Smoker   . Smokeless tobacco: Never Used  . Alcohol Use: No  . Drug Use: No  . Sexual  Activity:    Partners: Male    Birth Control/ Protection: Implant     Comment: nexplanon   Other Topics Concern  . Not on file   Social History Narrative   Patient is single with no children.   Patient is right handed.   Patient has a high school education and currently in college.   Patient drinks caffeine occasionally.     PHYSICAL EXAM  Filed Vitals:   03/06/15 1020  BP: 134/84  Pulse: 101  Height: 5\' 6"  (1.676 m)  Weight: 134 lb 12.8 oz (61.145 kg)   Body mass index is 21.77 kg/(m^2).  Generalized: Well developed, in no acute distress  Head: normocephalic and atraumatic,. Oropharynx benign  Neck: Supple, no carotid bruits  Cardiac: Regular rate rhythm, no murmur  Musculoskeletal: No deformity   Neurological examination   Mentation: Alert oriented to time, place, history taking. Attention span and concentration appropriate. Recent and remote memory intact.  Follows all commands speech and language fluent.   Cranial nerve II-XII: Fundoscopic exam Deferred .Pupils were equal round reactive to light extraocular movements were full, visual field were full on confrontational test. Facial sensation and strength were normal. hearing was intact to finger rubbing bilaterally. Uvula tongue midline. head turning and shoulder shrug were normal and symmetric.Tongue protrusion into cheek strength was normal. Motor: normal bulk and tone, full strength in the BUE, BLE, fine finger movements normal, no pronator drift. No focal weakness Sensory: normal and symmetric to light touch, pinprick, and  Vibration, proprioception  Coordination: finger-nose-finger, heel-to-shin bilaterally, no dysmetria Reflexes: Brachioradialis 2/2, biceps 2/2, triceps 2/2, patellar 2/2, Achilles 2/2, plantar responses were flexor bilaterally. Gait and Station: Not ambulated  DIAGNOSTIC DATA (LABS, IMAGING, TESTING) - I reviewed patient records, labs, notes, testing and imaging myself where available.  Lab  Results  Component Value Date   WBC 6.6 08/26/2013   HGB 14.1 08/26/2013   HCT 40.0 08/26/2013   MCV 88 08/26/2013   PLT 326 08/26/2013      Component Value Date/Time   NA 138 08/26/2013 0912   K 3.9 08/26/2013 0912   CL 105 08/26/2013 0912   CO2 23 08/26/2013 0912   GLUCOSE 84 08/26/2013 0912   BUN 8 08/26/2013 0912  CREATININE 0.65 08/26/2013 0912   CALCIUM 9.0 08/26/2013 0912   PROT 7.1 08/26/2013 0912   ALBUMIN 4.6 08/26/2013 0912   AST 11 08/26/2013 0912   ALT 6 08/26/2013 0912   ALKPHOS 59 08/26/2013 0912   BILITOT <0.2 08/26/2013 0912   GFRNONAA 129 08/26/2013 0912   GFRAA 149 08/26/2013 0912    ASSESSMENT AND PLAN  20 y.o. year old female  has a past medical history of  Migraine. here to follow-up. She was just seen in the emergency room yesterday. She woke up with a headache this morning  Will give migraine cocktail, 1 g of Depacon, 30 mg Toradol, 10 mg of Compazine and 500 mg of Solu-Medrol IV Prednisone 6 day dose pack called in  Continue nortriptyline. F/U with Dr. Lucia Gaskins as planned for Botox Vst time 30 min Nilda Riggs, [facility], [facility], APRN  [facility] [address], [address] [place], [place] [address] [phone]  Personally examined images, and have participated in and made any corrections needed to history, physical, neuro exam,assessment and plan as stated above and agree with plan.    Naomie Dean, MD Neurology [facility]

## 2015-03-06 NOTE — Telephone Encounter (Signed)
Called back and spoke to [place]. Ext number did not go to correct person. She transferred me to pharmacy. They took ICD-10 code G43.701. I verified her address and gave [facility] address for shipping of BOTOX. They made shipping date 2/29/17 and it will come with welcome package that needs to be given to pt because there are a couple forms that needs to be signed by pt.

## 2015-03-06 NOTE — Patient Instructions (Signed)
Will give migraine cocktail Prednisone 6 day dose pack called in . F/U with Dr. Lucia Gaskins as planned

## 2015-03-07 NOTE — Telephone Encounter (Signed)
Called mother back. Mother called Inetta Fermo, our infusion nurse this morning. Pt took zofran between 715/730am. She threw it right back up. Can not keep anything down. She is not sure if daughter is taking propranolol or not. She is going to check with her daughter and pharmacy. Advised per Dr Lucia Gaskins that she needs to take her to urgent care to get her vomiting under control. Mother hesitant because she thinks they will not do anything for her. "They will just give her a suppository of phenergan or something and send her on her way". Advised per Dr Lucia Gaskins there is nothing more we can offer at this point. She is coming for botox tomorrow. Mother understands but worried about her daughter. I advised Dr Lucia Gaskins does not have any openings today. Reinforced she needs to bring her to urgent care. I recommended [facility]. She wanted me to email to her daughter, but daughter not set up on mychart. Mother could not write it down she was at the pharmacy picking up Fioricet. She stated it needed to be approved by the insurance again. She stated "my daughter has mentioned wanting to hurt herself because she is in so much pain". I advised that she needs to bring her to the ER and admit her if she is saying those things. This is not something to take lightly. She agreed and stated she also told her daughter this. She has been living out of daughter's dorm room the past two days. She states she is very frustrated.

## 2015-03-07 NOTE — Telephone Encounter (Signed)
Spoke to mother. Migraine was better after the migraine cocktail 1/10 and then worsened this morning. She is throwing up and can't stop. Did advise that I agree she should go to the emergency room if the vomiting is intractable

## 2015-03-07 NOTE — Telephone Encounter (Signed)
Patients mother wanted Dr. Lucia Gaskins to know [facility] medicine. No need to return call unless you have question.

## 2015-03-08 ENCOUNTER — Ambulatory Visit (INDEPENDENT_AMBULATORY_CARE_PROVIDER_SITE_OTHER): Payer: BC Managed Care – PPO | Admitting: Neurology

## 2015-03-08 VITALS — BP 117/78 | HR 102 | Ht 66.0 in | Wt 138.2 lb

## 2015-03-08 DIAGNOSIS — R112 Nausea with vomiting, unspecified: Secondary | ICD-10-CM

## 2015-03-08 DIAGNOSIS — G43001 Migraine without aura, not intractable, with status migrainosus: Secondary | ICD-10-CM

## 2015-03-08 DIAGNOSIS — G43701 Chronic migraine without aura, not intractable, with status migrainosus: Secondary | ICD-10-CM

## 2015-03-08 MED ORDER — PROMETHAZINE HCL 25 MG RE SUPP: 25.0000 mg | Freq: Four times a day (QID) | RECTAL | Status: DC | PRN

## 2015-03-08 MED ORDER — ONDANSETRON 4 MG PO TBDP: 4.0000 mg | ORAL_TABLET | Freq: Three times a day (TID) | ORAL | Status: DC | PRN

## 2015-03-08 NOTE — Progress Notes (Signed)
Consent Form Botulism Toxin Injection For Chronic Migraine  Botulism toxin has been approved by the Federal drug administration for treatment of chronic migraine. Botulism toxin does not cure chronic migraine and it may not be effective in some patients.  The administration of botulism toxin is accomplished by injecting a small amount of toxin into the muscles of the neck and head. Dosage must be titrated for each individual. Any benefits resulting from botulism toxin tend to wear off after 3 months with a repeat injection required if benefit is to be maintained. Injections are usually done every 3-4 months with maximum effect peak achieved by about 2 or 3 weeks. Botulism toxin is expensive and you should be sure of what costs you will incur resulting from the injection.  The side effects of botulism toxin use for chronic migraine may include:   -Transient, and usually mild, facial weakness with facial injections  -Transient, and usually mild, head or neck weakness with head/neck injections  -Reduction or loss of forehead facial animation due to forehead muscle weakness  -Eyelid drooping  -Dry eye  -Pain at the site of injection or bruising at the site of injection  -Double vision  -Potential unknown long term risks  Contraindications: You should not have Botox if you are pregnant, nursing, allergic to albumin, have an infection, skin condition, or muscle weakness at the site of the injection, or have myasthenia gravis, Lambert-Eaton syndrome, or ALS.  It is also possible that as with any injection, there may be an allergic reaction or no effect from the medication. Reduced effectiveness after repeated injections is sometimes seen and rarely infection at the injection site may occur. All care will be taken to prevent these side effects. If therapy is given over a long time, atrophy and wasting in the muscle injected may occur. Occasionally the patient's become refractory to treatment because  they develop antibodies to the toxin. In this event, therapy needs to be modified.  I have read the above information and consent to the administration of botulism toxin.  On file  ______________  _____   _________________  Patient signature     Date   Witness signature       BOTOX PROCEDURE NOTE FOR MIGRAINE HEADACHE    Contraindications and precautions discussed with patient(above). Aseptic procedure was observed and patient tolerated procedure. Procedure performed by Dr. Artemio Aly  The condition has existed for more than 6 months, and pt does not have a diagnosis of ALS, Myasthenia Gravis or Lambert-Eaton Syndrome. Risks and benefits of injections discussed and pt agrees to proceed with the procedure. Written consent obtained  These injections are medically necessary. These injections do not cause sedations or hallucinations which the oral therapies may cause.  Indication/Diagnosis: chronic migraine BOTOX(J0585) injection was performed according to protocol by [employer]. 200 units of BOTOX was dissolved into 4 cc NS.  NDC: [address]-56  Type of toxin: Botox  Botox-100unitsx2 vials  Lot: [id number]  Expiration: 09/2017  [id number]   0.9% Sodium Chloride- 4mL total  [id number]  Expiration: 11/2016  NDC: 27253-664-40   Description of procedure:  The patient was placed in a sitting position. The standard protocol was used for Botox as follows, with 5 units of Botox injected at each site:   -Procerus muscle, midline injection  -Corrugator muscle, bilateral injection  -Frontalis muscle, bilateral injection, with 2 sites each side, medial injection was performed in the upper one third of the frontalis muscle, in the region vertical from the medial inferior  edge of the superior orbital rim. The lateral injection was again in the upper one third of the forehead vertically above the lateral limbus of the cornea, 1.5 cm lateral to the medial injection site.  -Temporalis  muscle injection, 4 sites, bilaterally. The first injection was 3 cm above the tragus of the ear, second injection site was 1.5 cm to 3 cm up from the first injection site in line with the tragus of the ear. The third injection site was 1.5-3 cm forward between the first 2 injection sites. The fourth injection site was 1.5 cm posterior to the second injection site.  -Occipitalis muscle injection, 3 sites, bilaterally. The first injection was done one half way between the occipital protuberance and the tip of the mastoid process behind the ear. The second injection site was done lateral and superior to the first, 1 fingerbreadth from the first injection. The third injection site was 1 fingerbreadth superiorly and medially from the first injection site.  -Cervical paraspinal muscle injection, 2 sites, bilateral knee first injection site was 1 cm from the midline of the cervical spine, 3 cm inferior to the lower border of the occipital protuberance. The second injection site was 1.5 cm superiorly and laterally to the first injection site.  -Trapezius muscle injection was performed at 3 sites, bilaterally. The first injection site was in the upper trapezius muscle halfway between the inflection point of the neck, and the acromion. The second injection site was one half way between the acromion and the first injection site. The third injection was done between the first injection site and the inflection point of the neck.   Will return for repeat injection in 3 months.   200 units of Botox was used, 155 units were injected, the rest of the Botox was wasted. The patient tolerated the procedure well, there were no complications of the above procedure.

## 2015-03-08 NOTE — Addendum Note (Signed)
Addended by: Naomie Dean B on: 03/08/2015 11:48 AM   Modules accepted: Orders

## 2015-03-08 NOTE — Progress Notes (Signed)
Botox-100unitsx2 vials Lot: [id number] Expiration: 09/2017 [id number]  0.9% Sodium Chloride- 4mL total [id number] Expiration: 11/2016 NDC: 08657-846-96

## 2015-03-12 NOTE — Telephone Encounter (Signed)
Pt called sts she is still having headache. It did dissipate Thursaday and Friday and most of Saturday but returned Saturday night (03/10/15). Pt was throwing up but took ondansetron (ZOFRAN-ODT) 4 MG disintegrating tablet starting Saturday night every 8 hrs, it stopped last night. [facility] her injection of Toradol but she is wanting to know what Dr Lucia GaskinsAhern recommends. She can be reached at [phone]

## 2015-03-12 NOTE — Telephone Encounter (Addendum)
Called pt and relayed message per Dr Lucia GaskinsAhern suggestions. Pt verbalized understanding. Pt stated she started propranolol last Wed night.  Advised this medication can take a little bit to start taking full effect. She understands.

## 2015-03-12 NOTE — Telephone Encounter (Signed)
Per Dr Lucia GaskinsAhern- we can order toradol shots for onset of headache. She also needs to start taking propranolol. Per Dr Lucia GaskinsAhern, mother picked up this rx from pharamacy.

## 2015-03-13 ENCOUNTER — Other Ambulatory Visit: Payer: Self-pay | Admitting: Neurology

## 2015-03-13 MED ORDER — KETOROLAC TROMETHAMINE 15.75 MG/SPRAY NA SOLN: 1.0000 | Freq: Four times a day (QID) | NASAL | Status: DC | PRN

## 2015-03-13 NOTE — Telephone Encounter (Signed)
I tried to order sprix. If insurance needs a PA we may need to have her in here to teach her how to inject toradol.

## 2015-04-10 ENCOUNTER — Emergency Department (HOSPITAL_COMMUNITY)
Admission: EM | Admit: 2015-04-10 | Discharge: 2015-04-10 | Disposition: A | Discharge status: Home / Self Care | Payer: BC Managed Care – PPO | Attending: Emergency Medicine | Admitting: Emergency Medicine

## 2015-04-10 ENCOUNTER — Encounter (HOSPITAL_COMMUNITY): Payer: Self-pay

## 2015-04-10 DIAGNOSIS — X58XXXA Exposure to other specified factors, initial encounter: Secondary | ICD-10-CM | POA: Diagnosis not present

## 2015-04-10 DIAGNOSIS — Y9289 Other specified places as the place of occurrence of the external cause: Secondary | ICD-10-CM | POA: Insufficient documentation

## 2015-04-10 DIAGNOSIS — R079 Chest pain, unspecified: Secondary | ICD-10-CM | POA: Diagnosis not present

## 2015-04-10 DIAGNOSIS — G43909 Migraine, unspecified, not intractable, without status migrainosus: Secondary | ICD-10-CM | POA: Diagnosis not present

## 2015-04-10 DIAGNOSIS — Y9389 Activity, other specified: Secondary | ICD-10-CM | POA: Insufficient documentation

## 2015-04-10 DIAGNOSIS — Z8709 Personal history of other diseases of the respiratory system: Secondary | ICD-10-CM | POA: Diagnosis not present

## 2015-04-10 DIAGNOSIS — R0602 Shortness of breath: Secondary | ICD-10-CM | POA: Diagnosis not present

## 2015-04-10 DIAGNOSIS — T7840XA Allergy, unspecified, initial encounter: Secondary | ICD-10-CM | POA: Diagnosis present

## 2015-04-10 DIAGNOSIS — Z79899 Other long term (current) drug therapy: Secondary | ICD-10-CM | POA: Diagnosis not present

## 2015-04-10 DIAGNOSIS — R21 Rash and other nonspecific skin eruption: Secondary | ICD-10-CM | POA: Diagnosis not present

## 2015-04-10 DIAGNOSIS — Y999 Unspecified external cause status: Secondary | ICD-10-CM | POA: Diagnosis not present

## 2015-04-10 DIAGNOSIS — Z793 Long term (current) use of hormonal contraceptives: Secondary | ICD-10-CM | POA: Diagnosis not present

## 2015-04-10 MED ORDER — PREDNISONE 20 MG PO TABS: ORAL_TABLET | ORAL | Status: DC

## 2015-04-10 MED ORDER — HYDROXYZINE HCL 50 MG/ML IM SOLN
50.0000 mg | Freq: Four times a day (QID) | INTRAMUSCULAR | Status: DC | PRN
  Filled 2015-04-10: qty 1

## 2015-04-10 MED ORDER — IBUPROFEN 800 MG PO TABS
800.0000 mg | ORAL_TABLET | Freq: Once | ORAL | Status: AC
  Administered 2015-04-10: 800 mg via ORAL
  Filled 2015-04-10: qty 1

## 2015-04-10 MED ORDER — SODIUM CHLORIDE 0.9 % IV BOLUS (SEPSIS)
1000.0000 mL | Freq: Once | INTRAVENOUS | Status: AC
  Administered 2015-04-10: 1000 mL via INTRAVENOUS

## 2015-04-10 MED ORDER — FAMOTIDINE IN NACL 20-0.9 MG/50ML-% IV SOLN
20.0000 mg | Freq: Once | INTRAVENOUS | Status: AC
  Administered 2015-04-10: 20 mg via INTRAVENOUS
  Filled 2015-04-10: qty 50

## 2015-04-10 MED ORDER — METHYLPREDNISOLONE SODIUM SUCC 125 MG IJ SOLR
125.0000 mg | Freq: Once | INTRAMUSCULAR | Status: AC
  Administered 2015-04-10: 125 mg via INTRAVENOUS
  Filled 2015-04-10: qty 2

## 2015-04-10 NOTE — Discharge Instructions (Signed)
Food Allergy A food allergy is an abnormal reaction to a food (food allergen) by the body's defense system (immune system). Foods that commonly cause allergies are:  Milk.  Seafood.  Eggs.  Nuts.  Wheat.  Soy. CAUSES Food allergies happen when the immune system mistakenly sees a food as harmful and releases antibodies to fight it. SIGNS AND SYMPTOMS Symptoms may be mild or severe. They usually start minutes after the food is eaten, but they can occur even a few hours later. In people with a severe allergy, symptoms can start within seconds. Mild Symptoms  Nasal congestion.  Tingling in the mouth.  An itchy, red rash.  Vomiting.  Diarrhea. Severe Symptoms  Swelling of the lips, face, and tongue.  Swelling of the back of the mouth and throat.  Wheezing.  A hoarse voice.  Itchy, red, swollen areas of skin (hives).  Dizziness or light-headedness.  Fainting.  Trouble breathing, speaking, or swallowing.  Chest tightness.  Rapid heartbeat. DIAGNOSIS A diagnosis is made with a physical exam, medical and family history, and one or more of the following:  Skin tests.  Blood tests.  A food diary.  The results of an elimination diet. The elimination diet involves removing foods from your diet and then adding them back in, one at a time. TREATMENT There is no cure for allergies. An allergic reaction can be treated with medicines, such as:  Antihistamines.  Steroids.  Respiratory inhalers.  Epinephrine. Severe symptoms can be a sign of a life-threatening reaction called anaphylaxis, and they require immediate treatment. Severe reactions usually need to be treated at a hospital. People who have had a severe reaction may be prescribed rescue medicines to take if they are accidentally exposed to an allergen. HOME CARE INSTRUCTIONS General Instructions  Avoid the foods that you are allergic to.  Read food labels before you eat packaged items. Look for  ingredients you are allergic to.  When you are at a restaurant, tell your server that you have an allergy. If you are unsure of whether a meal has an ingredient that you are allergic to, ask your server.  Take medicines only as directed by your health care provider. Do not drive until the medicine has worn off, unless your health care provider gives you approval.  Inform all health care providers that you have a food allergy.  Contact your health care provider if you want to be tested for an allergy. If you have had an anaphylactic reaction before, you should never test yourself for an allergy without your health care provider's approval. Instructions for People with Severe Allergies  Wear a medical alert bracelet or necklace that describes your allergy.  Carry your anaphylaxis kit or an epinephrine injection with you at all times. Use them as directed by your health care provider.  Make sure that you, your family members, and your employer know:  How to use an anaphylaxis kit.  How to give an epinephrine injection.  Replace your epinephrine immediately after use, in case you have another reaction.  Seek medical care even after you take epinephrine. This is important because epinephrine can be followed by a delayed, life-threatening reaction. Instructions for People with a Potential Allergy  Follow the elimination diet as directed by your health care provider.  Keep a food diary as directed by your health care provider. Every day, write down:  What you eat and drink and when.  What symptoms you have and when. SEEK MEDICAL CARE IF:  Your symptoms  have not gone away within 2 days. °· Your symptoms get worse. °· You develop new symptoms. °SEEK IMMEDIATE MEDICAL CARE IF: °· You use epinephrine. °· You are having a severe allergic reaction. Symptoms of a severe reaction include: °¨ Swelling of the lips, face, and tongue. °¨ Swelling of the back of the mouth and throat. °¨ Wheezing. °¨ A  hoarse voice. °¨ Hives. °¨ Dizziness or light-headedness. °¨ Fainting. °¨ Trouble breathing, speaking, or swallowing. °¨ Chest tightness. °¨ Rapid heartbeat. °  °This information is not intended to replace advice given to you by your health care provider. Make sure you discuss any questions you have with your health care provider. °  °Document Released: 12/21/1999 Document Revised: 01/13/2014 Document Reviewed: 10/04/2013 °Elsevier Interactive Patient Education ©2016 [employer] ° °

## 2015-04-10 NOTE — ED Provider Notes (Signed)
CSN: [id number]     Arrival date & time 04/10/15  0210 History  By signing my name below, I, Bethel Born, attest that this documentation has been prepared under the direction and in the presence of Gilda Crease, MD. Electronically Signed: Bethel Born, ED Scribe. 04/10/2015. 2:27 AM   Chief Complaint  Patient presents with  . Allergic Reaction   The history is provided by the patient and a friend. No language interpreter was used.   Lindsey Fitzgerald is a 20 y.o. female who presents to the Emergency Department complaining of constant and moderate chest pain and SOB with sudden onset just PTA while eating egg drop soup. 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 Associated symptoms include facial redness. Pt denies itching. She did not take Benadryl PTA because it has been associated with severe headaches in her experience.   Past Medical History  Diagnosis Date  . Tension pneumothorax   . Premature baby   . Migraine    Past Surgical History  Procedure Laterality Date  . None    . Wisdom tooth extraction    . Tympanostomy tube placement    . Nexplanon       insertion 04-13-14   Family History  Problem Relation Age of Onset  . Diabetes Mother   . Hyperlipidemia Mother   . Hypertension Mother   . Other Mother     MENIERES  . Migraines Father   . Other Father     Brain Tumor  . Stroke Maternal Grandmother   . Stroke Maternal Grandfather   . Hypertension Maternal Grandfather   . Diabetes Paternal Uncle    Social History  Substance Use Topics  . Smoking status: Never Smoker   . Smokeless tobacco: Never Used  . Alcohol Use: No   OB History    Gravida Para Term Preterm AB TAB SAB Ectopic Multiple Living       Review of Systems  HENT: Negative for trouble swallowing.   Respiratory:  Positive for shortness of breath.   Cardiovascular: Positive for chest pain.  Skin: Positive for color change. Negative for itching and rash.  All other systems reviewed and are negative.  Allergies  Mushroom extract complex; Other; Ketorolac tromethamine; Sumatriptan; and Asparagus  Home Medications   Prior to Admission medications   Medication Sig Start Date End Date Taking? Authorizing Provider  Butalbital-APAP-Caffeine (FIORICET) 50-300-40 MG CAPS Take 1 capsule by mouth once. Patient taking differently: Take 1 capsule by mouth daily as needed (migraine).  08/21/14  Yes Anson Fret, MD  Diclofenac Potassium (CAMBIA) 50 MG PACK Take 50 mg by mouth daily as needed (migraine).    Yes Historical Provider, MD  EPINEPHrine 0.3 mg/0.3 mL IJ SOAJ injection Inject 0.3 mg into the skin daily as needed (allergic reaction).  08/18/14  Yes Historical Provider, MD  escitalopram (LEXAPRO) 20 MG tablet Take 20 mg by mouth at bedtime.  08/18/14 08/18/15 Yes Historical Provider, MD  etonogestrel (NEXPLANON) 68 MG IMPL implant 1 each by Subdermal route once.   Yes Historical Provider, MD  glycopyrrolate (ROBINUL) 1 MG tablet Take 2 mg by mouth 2 (two) times daily.  01/20/15  Yes Historical Provider, MD  ibuprofen (ADVIL,MOTRIN) 200 MG tablet Take 200 mg by mouth every  6 (six) hours as needed for headache, mild pain or moderate pain.   Yes Historical Provider, MD  NAPROXEN PO Take 220 mg by mouth as needed (headache).    Yes Historical Provider, MD  nortriptyline (PAMELOR) 50 MG capsule Take 1 capsule (50 mg total) by mouth at bedtime. 02/09/15  Yes Anson FretAntonia B Ahern, MD  ondansetron (ZOFRAN-ODT) 4 MG disintegrating tablet Take 1 tablet (4 mg total) by mouth every 8 (eight) hours as needed for nausea. 03/08/15  Yes Anson FretAntonia B Ahern, MD  promethazine (PHENERGAN) 25 MG suppository Place 1 suppository (25 mg total) rectally every 6 (six) hours as needed for nausea or vomiting. 03/08/15  Yes Anson FretAntonia B Ahern, MD  Ketorolac  Tromethamine 15.75 MG/SPRAY SOLN Place 1 spray into the nose every 6 (six) hours as needed. Patient not taking: Reported on 04/10/2015 03/13/15   Anson FretAntonia B Ahern, MD  predniSONE (DELTASONE) 20 MG tablet 3 tabs po daily x 3 days, then 2 tabs x 3 days, then 1.5 tabs x 3 days, then 1 tab x 3 days, then 0.5 tabs x 3 days 04/10/15   Gilda Creasehristopher J Sergi Gellner, MD   BP 128/86 mmHg  Pulse 94  Temp(Src) 97.9 F (36.6 C) (Oral)  Resp 18  Wt 135 lb (61.236 kg)  SpO2 100% Physical Exam  Constitutional: She is oriented to person, place, and time. She appears well-developed and well-nourished. No distress.  HENT:  Head: Normocephalic and atraumatic.  Right Ear: Hearing normal.  Left Ear: Hearing normal.  Nose: Nose normal.  Mouth/Throat: Oropharynx is clear and moist and mucous membranes are normal.  Eyes: Conjunctivae and EOM are normal. Pupils are equal, round, and reactive to light.  Neck: Normal range of motion. Neck supple.  Cardiovascular: Regular rhythm, S1 normal and S2 normal.  Exam reveals no gallop and no friction rub.   No murmur heard. Pulmonary/Chest: Effort normal and breath sounds normal. Tachypnea noted. No respiratory distress. She exhibits no tenderness.  Pt very tachypneic and hyperventilating with clear lungs  Abdominal: Soft. Normal appearance and bowel sounds are normal. There is no hepatosplenomegaly. There is no tenderness. There is no rebound, no guarding, no tenderness at McBurney's point and negative Murphy's sign. No hernia.  Musculoskeletal: Normal range of motion.  Neurological: She is alert and oriented to person, place, and time. She has normal strength. No cranial nerve deficit or sensory deficit. Coordination normal. GCS eye subscore is 4. GCS verbal subscore is 5. GCS motor subscore is 6.  Skin: Skin is warm, dry and intact. No rash noted. There is erythema. No cyanosis.  Patchy diffuse erythema on face, arms, and torso without discrete urticaria   Psychiatric: She has a  normal mood and affect. Her speech is normal and behavior is normal. Thought content normal.  Nursing note and vitals reviewed.   ED Course  Procedures (including critical care time) DIAGNOSTIC STUDIES: Oxygen Saturation is 100% on RA,  normal by my interpretation.    COORDINATION OF CARE: 2:24 AM Discussed treatment plan which includes Solu-Medrol, Pepcid, hydroxyzine, and IVF  with pt at bedside and pt agreed to plan.  Labs Review Labs Reviewed - No data to display  Imaging Review No results found.   EKG Interpretation None      MDM   Final diagnoses:  Allergic reaction, initial encounter    Patient presents to the ER for evaluation of food allergy. She has an allergy to mushrooms. She developed shortness of breath and rash immediately after eating tonight. She  reports an allergy to Benadryl. She was administered Vistaril, Pepcid, Solu-Medrol and observed. Patient's rash has completely resolved. She is no longer having difficulty breathing. She is back to her baseline. She will be discharged, prescribed a prednisone taper. She has an EpiPen available.  I personally performed the services described in this documentation, which was scribed in my presence. The recorded information has been reviewed and is accurate.     Gilda Crease, MD 04/10/15 [phone]

## 2015-04-10 NOTE — ED Notes (Signed)
Pt was eating egg drop soup and started to have difficutly breathing, pt has epi pen for mushroom allergy but does not want to use it unless it was allergy to mushroom.

## 2015-04-10 NOTE — ED Notes (Signed)
Pt requested ibuprofen for pain control. Pollina informed and gave verbal order for 800 mg PO. Pt  reports she takes ibuprofen often with no adverse reaction. Dr. Blinda LeatherwoodPollina aware of pts allergy to Ketorolac and states he will come speak with patient prior to administration of the ibuprofen.

## 2015-04-11 ENCOUNTER — Ambulatory Visit: Payer: BC Managed Care – PPO | Admitting: Certified Nurse Midwife

## 2015-06-12 ENCOUNTER — Ambulatory Visit: Payer: BC Managed Care – PPO | Admitting: Certified Nurse Midwife

## 2015-06-14 ENCOUNTER — Ambulatory Visit (INDEPENDENT_AMBULATORY_CARE_PROVIDER_SITE_OTHER): Payer: BC Managed Care – PPO | Admitting: Neurology

## 2015-06-14 VITALS — BP 119/82 | HR 83

## 2015-06-14 DIAGNOSIS — G43701 Chronic migraine without aura, not intractable, with status migrainosus: Secondary | ICD-10-CM

## 2015-06-14 MED ORDER — PROPRANOLOL HCL ER 80 MG PO CP24: 80.0000 mg | ORAL_CAPSULE | Freq: Every day | ORAL | Status: DC

## 2015-06-14 MED ORDER — DULOXETINE HCL 60 MG PO CPEP: 60.0000 mg | ORAL_CAPSULE | Freq: Every day | ORAL | Status: DC

## 2015-06-14 NOTE — Patient Instructions (Addendum)
Remember to drink plenty of fluid, eat healthy meals and do not skip any meals. Try to eat protein with a every meal and eat a healthy snack such as fruit or nuts in between meals. Try to keep a regular sleep-wake schedule and try to exercise daily, particularly in the form of walking, 20-30 minutes a day, if you can.   As far as your medications are concerned, I would like to suggest: Increase Propranolol to 80mg  at night In 2 weeks if no relief start cymbalta  I would like to see you back in 6 weeks, sooner if we need to. Please call us with any interim questions, concerns, problems, updates or refill requests.   Our phone number is [phone]. We also have an after hours call service for urgent matters and there is a physician on-call for urgent questions. For any emergencies you know to call 911 or go to the nearest emergency room

## 2015-06-14 NOTE — Progress Notes (Signed)
Consent Form Botulism Toxin Injection For Chronic Migraine  Botulism toxin has been approved by the Federal drug administration for treatment of chronic migraine. Botulism toxin does not cure chronic migraine and it may not be effective in some patients.  The administration of botulism toxin is accomplished by injecting a small amount of toxin into the muscles of the neck and head. Dosage must be titrated for each individual. Any benefits resulting from botulism toxin tend to wear off after 3 months with a repeat injection required if benefit is to be maintained. Injections are usually done every 3-4 months with maximum effect peak achieved by about 2 or 3 weeks. Botulism toxin is expensive and you should be sure of what costs you will incur resulting from the injection.  The side effects of botulism toxin use for chronic migraine may include:  -Transient, and usually mild, facial weakness with facial injections -Transient, and usually mild, head or neck weakness with head/neck injections -Reduction or loss of forehead facial animation due to forehead muscle weakness -Eyelid drooping -Dry eye -Pain at the site of injection or bruising at the site of injection -Double vision -Potential unknown long term risks  Contraindications: You should not have Botox if you are pregnant, nursing, allergic to albumin, have an infection, skin condition, or muscle weakness at the site of the injection, or have myasthenia gravis, Lambert-Eaton syndrome, or ALS.  It is also possible that as with any injection, there may be an allergic reaction or no effect from the medication. Reduced effectiveness after repeated injections is sometimes seen and rarely infection at the injection site may occur. All care will be taken to prevent these side effects. If therapy is given over a long time, atrophy and wasting in the muscle  injected may occur. Occasionally the patient's become refractory to treatment because they develop antibodies to the toxin. In this event, therapy needs to be modified.  I have read the above information and consent to the administration of botulism toxin.  On file ____________________________________ Patient signature DateWitness signature       BOTOX PROCEDURE NOTE FOR MIGRAINE HEADACHE    Contraindications and precautions discussed with patient(above). Aseptic procedure was observed and patient tolerated procedure. Procedure performed by Dr. Artemio Aly  The condition has existed for more than 6 months, and pt does not have a diagnosis of ALS, Myasthenia Gravis or Lambert-Eaton Syndrome. Risks and benefits of injections discussed and pt agrees to proceed with the procedure. Written consent obtained  These injections are medically necessary. These injections do not cause sedations or hallucinations which the oral therapies may cause.  Indication/Diagnosis: chronic migraine BOTOX(J0585) injection was performed according to protocol by [employer]. 200 units of BOTOX was dissolved into 4 cc NS.  NDC: [address]-40  Type of toxin: Botox  Botox-100unitsx2 vials  Lot: [id number] Expiration: 12/2017  [id number]   0.9% Sodium Chloride- 4mL total  NFA:[id number]  Expiration: 11/2016  NDC: 78469-629-52   Description of procedure:  The patient was placed in a sitting position. The standard protocol was used for Botox as follows, with 5 units of Botox injected at each site unless specified differently:   -Procerus muscle, midline injection  -Corrugator muscle, bilateral injection  -Frontalis muscle, bilateral injection, with 2 sites each side, medial injection was performed in the upper one third of the frontalis muscle, in the region vertical from the medial  inferior edge of the superior orbital rim. The lateral injection was again in the upper one third of the forehead vertically  above the lateral limbus of the cornea, 1.5 cm lateral to the medial injection site.  -Temporalis muscle injection, 4 sites, bilaterally. The first injection was 3 cm above the tragus of the ear, second injection site was 1.5 cm to 3 cm up from the first injection site in line with the tragus of the ear. The third injection site was 1.5-3 cm forward between the first 2 injection sites(this site was injection additionally bilaterally with 5 extra units). The fourth injection site was 1.5 cm posterior to the second injection site.  -Occipitalis muscle injection, 3 sites, bilaterally. The first injection was done one half way between the occipital protuberance and the tip of the mastoid process behind the ear. The second injection site was done lateral and superior to the first, 1 fingerbreadth from the first injection. The third injection site was 1 fingerbreadth superiorly and medially from the first injection site.  -Cervical paraspinal muscle injection, 2 sites, bilateral knee first injection site was 1 cm from the midline of the cervical spine, 3 cm inferior to the lower border of the occipital protuberance. The second injection site was 1.5 cm superiorly and laterally to the first injection site. (this site was injected additionally bilaterally in 2 sites with 5 extra units each injection)  -Trapezius muscle injection was performed at 3 sites, bilaterally. The first injection site was in the upper trapezius muscle halfway between the inflection point of the neck, and the acromion. The second injection site was one half way between the acromion and the first injection site. The third injection was done between the first injection site and the inflection point of the neck.   Will return for repeat injection in 3 months.   200 units of Botox was used, 185 units were injected, the  rest of the Botox(15 units) was wasted. The patient tolerated the procedure well, there were no complications of the above procedure.

## 2015-06-16 ENCOUNTER — Encounter: Payer: Self-pay | Admitting: Neurology

## 2015-06-16 NOTE — Addendum Note (Signed)
Addended by: Naomie DeanAHERN, ANTONIA B on: 06/16/2015 09:15 AM   Modules accepted: Orders

## 2015-06-19 ENCOUNTER — Encounter: Payer: Self-pay | Admitting: *Deleted

## 2015-06-19 ENCOUNTER — Telehealth: Payer: Self-pay | Admitting: *Deleted

## 2015-06-19 NOTE — Telephone Encounter (Signed)
Called pt pharmacy. Verified they never received rx sprix. He advised us to send new rx for pt. Will fill out paperwork to re-order sprix/cambia.

## 2015-06-19 NOTE — Progress Notes (Signed)
Faxed cambia enrollment form to [facility]. Fax: [phone]. Received confirmation 

## 2015-06-20 NOTE — Progress Notes (Addendum)
FYI- Received update from [facility]. They received referral and are processing request. Asked to allow 24-48hr to process per Arlana PouchJaime H, intake dept.

## 2015-06-20 NOTE — Telephone Encounter (Signed)
LVM for pt to call back. To to discuss allergies with her. Gave [facility] phone number.

## 2015-06-20 NOTE — Progress Notes (Signed)
See other phone note. LVM for pt to call.

## 2015-06-20 NOTE — Progress Notes (Signed)
Would you call and ask her about the sprix, possible allergy?

## 2015-06-21 NOTE — Telephone Encounter (Signed)
Faxed sp[facility] enrollment/rx [facility] Fax: [phone]. Received confirmation.

## 2015-06-21 NOTE — Telephone Encounter (Signed)
Dr Lucia GaskinsAhern- FYI Called and spoke to pt. She stated the toradol caused insomnia. Denies rash/fever/CP/trouble breathing. Advised pt Sprix has same component as toradol. She is willing to try sprix and see how she tolerates medication. Advised I will send in her information. She verbalized understanding.

## 2015-07-03 NOTE — Telephone Encounter (Signed)
Called and spoke to pt. Advised she needs to call [facility] to receive medication. Relayed message below. Gave her number [phone] to call and [facility] She verbalized understanding and stated she is going to call.

## 2015-07-03 NOTE — Telephone Encounter (Signed)
Called [facility] to check on status of sprix prescription that was sent on 06/21/15. Spoke with [place]. She stated they got rx but had to put back on file. Tried several time to contact patient but was unable to reach her. Garner NashSiam stated they called on the following dates: LVM on 6/19. 6/20 spoke with someone at the house and said pt not available. LVM for pt to call back. 6/22 someone answered phone and said wrong number. 6/23 LVM and did not hear back. She stated they now have to call and verify information and have prescription reprocessed before it can be shipped since it was put back on the shelf. Advised I will call mother and relay message. She verbalized understanding.

## 2015-07-03 NOTE — Telephone Encounter (Signed)
Mom called to advise, hasn't heard anything from pharmacy regarding nasal Toradol. Please call [phone].

## 2015-07-12 ENCOUNTER — Encounter: Payer: Self-pay | Admitting: Certified Nurse Midwife

## 2015-07-12 ENCOUNTER — Ambulatory Visit: Payer: BC Managed Care – PPO | Admitting: Certified Nurse Midwife

## 2015-07-25 ENCOUNTER — Telehealth: Payer: Self-pay | Admitting: Neurology

## 2015-07-25 NOTE — Telephone Encounter (Signed)
Unable to get in contact with Sher[facility] at [facility] at [phone].Tried calling the number several times and no one answer and there is no vm set up about script for sprik.

## 2015-07-25 NOTE — Telephone Encounter (Signed)
Henry/[facility] [phone] called said pt told him she has kidney issues and the script for sprik reads to spray each nostril every 6 - 8 hrs. He said pt told him she is getting kidney reflux from torodol injection and sprik is torodol. He needs auth from provider to continue with the directions or change the directions.

## 2015-07-25 NOTE — Telephone Encounter (Signed)
Patient is calling because [facility] and was told their telephone # was out of order this morning so please call them back at [phone] regarding script Sprix as in below message.

## 2015-07-25 NOTE — Telephone Encounter (Signed)
Heather/[facility] [phone] called regarding Sprix Nasal Solution directions, patient has kidney issues.

## 2015-07-25 NOTE — Telephone Encounter (Signed)
Rn call [facility] about patients medication Sprix. The pharmacist stated its a new medication, and pt states she was having urine/kidney issues with a previous medication that had tordol in it. Rn stated Dr.Ahern is on vacation till MOnday. They will hold the medication for now.

## 2015-07-25 NOTE — Telephone Encounter (Signed)
Rn talk to patient about the medication. Pt stated she is fine with talking the medication. Pt also stated the scrpt says 2 sprays, but the pharmacy stated it should be one spray for each nostril. Message will be sent to Dr. Pearlean BrownieSethi. PT verbalized understanding. Message will be sent to Dr.Sethi for verbal order.

## 2015-07-26 NOTE — Telephone Encounter (Signed)
RN call [facility]ily[facility]ooter[facility] at [facility]Rn gave verbal order per Dr. Pearlean BrownieSEthi for patient to have 1 spray each nostril every 6 to 8 hours. Rn stated Dr. Pearlean BrownieSethi is just the work in and future refills and issue would be forward to Dr.Ahern. Sherilyn CooterHenry took direction change and verbalized understanding.

## 2015-07-26 NOTE — Telephone Encounter (Signed)
Spoke to American [employer]Henry @ [facility]. Nasal spray directions verified. Pharmacy has spoken to patient. No additional info needed.

## 2015-07-26 NOTE — Telephone Encounter (Signed)
ok 

## 2015-07-26 NOTE — Telephone Encounter (Addendum)
Called patient - states she is unaware of any kidney problems. She is just now able to afford the prescription and has not started the medication yet. Called [facility] and did not have any recent labs on patient.

## 2015-08-02 ENCOUNTER — Encounter: Payer: Self-pay | Admitting: Neurology

## 2015-08-02 ENCOUNTER — Ambulatory Visit (INDEPENDENT_AMBULATORY_CARE_PROVIDER_SITE_OTHER): Payer: BC Managed Care – PPO | Admitting: Neurology

## 2015-08-02 VITALS — BP 98/68 | HR 97 | Ht 66.0 in | Wt 128.4 lb

## 2015-08-02 DIAGNOSIS — G43709 Chronic migraine without aura, not intractable, without status migrainosus: Secondary | ICD-10-CM

## 2015-08-02 MED ORDER — PROPRANOLOL HCL ER 60 MG PO CP24: 60.0000 mg | ORAL_CAPSULE | Freq: Every day | ORAL | 12 refills | Status: DC

## 2015-08-02 NOTE — Progress Notes (Signed)
[facility]    Provider:  Dr Lucia Gaskins   Her headaches have improved since starting Botox. Now only 9-10 a month. When she has a migraine takes ketorolac spray in the nose and then goes to sleep and it helps. Sprix works. Cymbalta is also helping, taking every day. She has lightheadedness with propranolol. Sign and fax letter to [phone] to the attention of [name]. She's had 2 Botox treatments. Discussion about Botox and she should at least have another one before we see Lindsey Fitzgerald improvement. She's been missing a lot of classes last semester and she asked me to write a letter, reviewed her diagnoses is to things we have done last year and I have agreed to write a letter. Patient still having 9010 a month but that is significant improvement from the 20-30 headaches she was having. We discussed sleeping well, migraine triggers, aching sure to minimize stress in her life. Patient endorses taking her medications despite receiving a letter that she was not feeling them, she denies then endorses compliance. We had a talk about a compliance today as well.  Interval history 02/09/2015: Patient here for follow up of migraines. On Inderal and Nortriptyline. No medication overuse. Had double vision with topamax. Takes zofran and fioricet (sparingly) for acute management, due to family history of triptan side effects she declines them. Out of a month she has 20/30 headaches for at least 6 months. At least 10 are migraines a month. They start behind the left eye. Throbbing/pounding, they can be 7/10 in pain and last all day. She has to go into a dark room. Light and sound hurt, needs quiet and still. Can last up to 24 hours. +Nausea. Last night she had a severe headache.  This is making her stress out, no depression. Last semester was hard. She has already missed 3 classes because of this. This has been going on for at least 6 months or longer. No aura. She is a good condiadate for botox migraine  therapy and will request. She woul dlike a migraine dog. Therapy and migraine dog.   The migraines started in 2nd grade. Father has migraines. Migraines start sometimes without an aura and the feel like general pain throughout head, at the back of the head, deep ehind eyes and something is burning a path from the back of her head to her eyes. Other times feels like someone is hitting her head with a hammer. She also gets pressure-type headaches. Headaches were 7-8/10 and would last from 30 minutes to days. Longest one was 4 days. Would have them 4-6 a week. +nausea. +phonophobia. Sleeping may help when it gets really bad. Only medication has really helped in the past. Triggers include weather changes such as bad thunderstorms, really cold or hot. She gets 8 hours of sleep. Is on regular sleep/wake cycle. MSG makes it worse as does artificial sugar. She was started on topamax 25mg  at night which has improved headaches. 50mg  caused hand paresthesias. Butterbur didn't help. Not sure if magnesium helped.   Interval history 10/31/2014: Migraines are worsening. One a week that lasts 2-3 days. She is using the cambia and fioricet(sparingly) for acute management (she declines triptans due to a family history of side effects). She is on the inderal. Will add nortriptyline at night which may help with her insomnia as well.  HISTORY OF PRESENT ILLNESS Lindsey Fitzgerald: Lindsey Fitzgerald, 20 year old female returns for follow-up. She began having a migraine Thursday, September 29 which lasted for 2 days and then  it came back on Sunday Oct 2,2016 and has been persistent. She has not taken Cambia she has only been taking the Fioricet. She has been nauseated and takes Zofran she has had a lot of photophobia. 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 She has very few foods that are triggers and she has tried to avoid those these would include  artificial sugar and MSG. She has also been taking a lot of ibuprofen and was cautioned against this due to rebound. She returns for reevaluation. Except for this last week of headaches her headache frequency is down from 4 times a week to 2 times a week. She continues to take Inderal LA as her preventive. She is afraid to use triptans as her father had cardiac arrest after using a triptan.  Dr. Lucia Gaskins Interval update 08/21/2014: Since the summer she has only had a few headaches. No side effects from the propranolol. Don't want to try a triptan due to bad reaction in her father. She has 1-2 smaller headaches every 1-2 weeks they last 2-3 hours and they can get a 4-5/10 just enough to be annoying. 4/month was 8/10. The cambia helps with acute management. She can have nausea with the migraines.   HPI: Lindsey Fitzgerald is a 20 y.o. female here as a follow up for migraines. She stopped the Topamax due to double vision. She is doing very well. Has had only 3 headaches. They lasted 2 hours or longer. She tried to sleep through them. Imitrex stopped her dad's heart when he was a teenager so she is very hesitant to try triptans. Toradol makes her hyper. Has not tried reglan. She uses fioricet and cambia acutely.   The migraines started in 2nd grade. Father has migraines. Migraines start sometimes without an aura and the feel like general pain throughout head, at the back of the head, deep ehind eyes and something is burning a path from the back of her head to her eyes. Other times feels like someone is hitting her head with a hammer. She also gets pressure-type headaches. Headaches were 7-8/10 and would last from 30 minutes to days. Longest one was 4 days. Would have them 4-6 a week. +nausea. +phonophobia. Sleeping may help when it gets really bad. Only medication has really helped in the past. Triggers include weather changes such as bad thunderstorms, really cold or hot. She gets 8 hours of sleep. Is on regular  sleep/wake cycle. MSG makes it worse as does artificial sugar. She was started on topamax 25mg  at night which has improved headaches. 50mg  caused hand paresthesias. Butterbur didn't help. Not sure if magnesium helped. Takes advil once a week for rescue. Doesn't take fioricet. No focal neurologic deficits during the headaches. Not sexually active. Not interested in having children anytime soon. She just started [employer]  Review of Systems: Patient complains of symptoms per HPI as well as the following symptoms: headache, insomia. Pertinent negatives per HPI. All others negative.   Social History   Social History  . Marital status: Single    Spouse name: N/A  . Number of children: 0  . Years of education: 12   Occupational History  . Not on file.   Social History Main Topics  . Smoking status: Never Smoker  . Smokeless tobacco: Never Used  . Alcohol use No  . Drug use: No  . Sexual activity: Yes  Partners: Male    Birth control/ protection: Implant     Comment: nexplanon   Other Topics Concern  . Not on file   Social History Narrative   Patient is single with no children.   Patient is right handed.   Patient has a high school education and currently in college.   Patient drinks caffeine occasionally.    Family History  Problem Relation Age of Onset  . Diabetes Mother   . Hyperlipidemia Mother   . Hypertension Mother   . Other Mother     MENIERES  . Migraines Father   . Other Father     Brain Tumor  . Stroke Maternal Grandmother   . Stroke Maternal Grandfather   . Hypertension Maternal Grandfather   . Diabetes Paternal Uncle     Past Medical History:  Diagnosis Date  . Migraine   . Premature baby   . Tension pneumothorax     Past Surgical History:  Procedure Laterality Date  . nexplanon      insertion 04-13-14  . None    . TYMPANOSTOMY TUBE PLACEMENT    . WISDOM TOOTH EXTRACTION      Current Outpatient Prescriptions  Medication Sig Dispense Refill  .  Diclofenac Potassium (CAMBIA) 50 MG PACK Take 50 mg by mouth daily as needed (migraine).     . DULoxetine (CYMBALTA) 60 MG capsule Take 1 capsule (60 mg total) by mouth daily. 30 capsule 11  . EPINEPHrine 0.3 mg/0.3 mL IJ SOAJ injection Inject 0.3 mg into the skin daily as needed (allergic reaction).     Marland Kitchen escitalopram (LEXAPRO) 20 MG tablet Take 20 mg by mouth at bedtime.     Marland Kitchen etonogestrel (NEXPLANON) 68 MG IMPL implant 1 each by Subdermal route once.    Marland Kitchen ibuprofen (ADVIL,MOTRIN) 200 MG tablet Take 200 mg by mouth every 6 (six) hours as needed for headache, mild pain or moderate pain.    Marland Kitchen Ketorolac Tromethamine 15.75 MG/SPRAY SOLN Place 1 spray into the nose every 6 (six) hours as needed. 10 each 1  . NAPROXEN PO Take 220 mg by mouth as needed (headache).     . nortriptyline (PAMELOR) 50 MG capsule Take 1 capsule (50 mg total) by mouth at bedtime. 30 capsule 11  . ondansetron (ZOFRAN-ODT) 4 MG disintegrating tablet Take 1 tablet (4 mg total) by mouth every 8 (eight) hours as needed for nausea. 30 tablet 11  . promethazine (PHENERGAN) 25 MG suppository Place 1 suppository (25 mg total) rectally every 6 (six) hours as needed for nausea or vomiting. 12 each 12  . propranolol ER (INDERAL LA) 80 MG 24 hr capsule Take 1 capsule (80 mg total) by mouth daily. 30 capsule 11   No current facility-administered medications for this visit.     Allergies as of 08/02/2015 - Review Complete 06/16/2015  Allergen Reaction Noted  . Mushroom extract complex Anaphylaxis 10/22/2014  . Other Swelling 08/21/2014  . Ketorolac tromethamine Other (See Comments) 08/26/2013  . Sumatriptan Other (See Comments) 08/26/2013  . Asparagus Other (See Comments) 02/09/2015    Vitals: BP 98/68 (BP Location: Right Arm, Patient Position: Sitting, Cuff Size: Normal)   Pulse 97   Ht 5\' 6"  (1.676 m)   Wt 128 lb 6.4 oz (58.2 kg)   BMI 20.72 kg/m  Last Weight:  Wt Readings from Last 1 Encounters:  08/02/15 128 lb 6.4 oz  (58.2 kg)   Last Height:   Ht Readings from Last 1 Encounters:  08/02/15 5'  6" (1.676 m)       Exam: Gen: NAD, conversant, well nourised, obese, well groomed  Eyes: Conjunctivae clear without exudates or hemorrhage  Neuro: Detailed Neurologic Exam  Speech:  Speech is normal; fluent and spontaneous with normal comprehension.  Cognition:  The patient is oriented to person, place, and time;  Cranial Nerves:  The pupils are equal, round, and reactive to light. The fundi are normal and spontaneous venous pulsations are present. Visual fields are full to finger confrontation. Extraocular movements are intact. Trigeminal sensation is intact and the muscles of mastication are normal. The face is symmetric. The palate elevates in the midline. Hearing intact. Voice is normal. Shoulder shrug is normal. The tongue has normal motion without fasciculations.   Strength:  Strength is V/V in the upper and lower limbs.     ASSESSMENT AND PLAN  20 y.o. year old femalewith chronic migraines w/o aura with status migrainosus not intractable  PLAN: As far as your medications are concerned, I would like to suggest:  Will continue Botox for migraines Continue Zofran at the onset of headache Limit use of ibuprofen and fioricet as it can cause rebound headache, given information on rebound Continue propranolol ER as at a lower dose and Nortriptyline Remember to drink plenty of fluid, eat healthy meals and do not skip any meals. Try to eat protein with a every meal and eat a healthy snack such as fruit or nuts in between meals. Try to keep a regular sleep-wake schedule and try to exercise daily, particularly in the form of walking, 20-30 minutes a day, if you can.   Addendum 06/14/2015: Topamax with double vision, still on propranolol with nortriptyline (not working still with 20/30 headache days a month and superimposed migraines at least 15 a month), she is  visiting the ED regularly, she never got the Sprix (will follow up), needs refills on cambia which helps, reglan helps a little bit, zofran heps with nausea, stopped refilling fioricet, fioricet didn't work, lexapro did not work, steroids, verapamil. Will increase Inderal and start Cymbalta.  Will continue nortriptyline at night which may help with her insomnia as well. No cardiac problems. Discussed side effects including teratogenicity, do not Pregnant on this medication and use birth control. Serious side effects can include hypotension, hypertension, syncope, ventricular arrhythmias, QT prolongation and other cardiac side effects, stroke and seizures, ataxia tardive dyskinesias, extrapyramidal symptoms, increased intraocular pressure, leukopenia, thrombocytopenia, hallucinations, suicidality and other serious side effects. Common reactions include drowsiness, dry mouth, dizziness, constipation, blurred vision, palpitations, tachycardia, impaired coordination, increased appetite, nausea vomiting, weakness, confusion, disorientation, restlessness, anxiety and other side effects.  To prevent or relieve headaches, try the following:  Cool Compress. Lie down and place a cool compress on your head.   Avoid headache triggers. If certain foods or odors seem to have triggered your migraines in the past, avoid them. A headache diary might help you identify triggers.   Include physical activity in your daily routine. Try a daily walk or other moderate aerobic exercise.   Manage stress. Find healthy ways to cope with the stressors, such as delegating tasks on your to-do list.   Practice relaxation techniques. Try deep breathing, yoga, massage and visualization.   Eat regularly. Eating regularly scheduled meals and maintaining a healthy diet might help prevent headaches. Also, drink plenty of fluids.   Follow a regular sleep schedule. Sleep deprivation might contribute to headaches  Consider  biofeedback. With this mind-body technique, you learn to control certain bodily functions -  such as muscle tension, heart rate and blood pressure - to prevent headaches or reduce headache pain.    Proceed to emergency room if you experience new or worsening symptoms or symptoms do not resolve, if you have new neurologic symptoms or if headache is severe, or for any concerning symptom.    Naomie Dean, MD  [facility] [address] [place], [place] [address]  Phone [phone] Fax [phone]  A total of 30 minutes was spent face-to-face with this patient. Over half this time was spent on counseling patient on the migraine diagnosis and different diagnostic and therapeutic options available.

## 2015-08-06 ENCOUNTER — Telehealth: Payer: Self-pay | Admitting: Neurology

## 2015-08-06 NOTE — Telephone Encounter (Signed)
Dr Lucia Gaskins- I am happy to write letter if you let me know what you would like and then I can send, thanks

## 2015-08-06 NOTE — Telephone Encounter (Signed)
Pt called in and would like a letter indicating her migraines increased and were worse from January of this year to present day. 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 Letter is due by Wednesday of this week. She said this was discussed during her office visit. May call [phone]

## 2015-08-06 NOTE — Telephone Encounter (Signed)
Yes, let's discuss tomorrow. I haven;t written it yet. Let's do it tomorrow, remind me and we can discuss. thanks

## 2015-08-07 ENCOUNTER — Encounter: Payer: Self-pay | Admitting: Neurology

## 2015-08-08 NOTE — Telephone Encounter (Signed)
Faxed letter from Dr Lucia Gaskins to fax: [phone] as requested per pt. Received confirmation.

## 2015-09-01 ENCOUNTER — Telehealth: Payer: Self-pay | Admitting: Diagnostic Neuroimaging

## 2015-09-01 NOTE — Telephone Encounter (Signed)
Patient called regarding new onset of small red dots on face yesterday  (below and around eyes). No pain, vision changes, numbness, tongue/mouth/breathing problems. No progression today. Patient calling to report sxs and discuss if it may be related to botox or sprix medications. Based on the timing of sxs, seems less likely to be related to those medications. Advised to monitor symptoms and go to ER or urgent care if rash progresses, swelling develops, or if vision, breathing, mouth or tongue become involved. Also advised followup with PCP or dermatology for further eval.    Suanne MarkerVIKRAM R. PENUMALLI, MD 09/01/2015, 9:32 AM Certified in Neurology, Neurophysiology and Neuroimaging  [facility] [address], [address] [place], [place] [address] [phone]

## 2015-09-24 ENCOUNTER — Ambulatory Visit: Payer: BC Managed Care – PPO | Admitting: Neurology

## 2015-09-27 ENCOUNTER — Ambulatory Visit: Payer: BC Managed Care – PPO | Admitting: Neurology

## 2015-09-27 ENCOUNTER — Telehealth: Payer: Self-pay | Admitting: Neurology

## 2015-09-27 NOTE — Telephone Encounter (Signed)
Mom Toniann FailWendy called 8:59 to cancel today's BOTOX appointment with Dr. Lucia GaskinsAhern, hose blew on radiator.

## 2015-10-01 ENCOUNTER — Encounter: Payer: Self-pay | Admitting: Neurology

## 2015-10-02 NOTE — Telephone Encounter (Signed)
Would you mind calling to reschedule this patient for her botox injection?

## 2015-10-02 NOTE — Telephone Encounter (Signed)
L/m for pt to r/s botox appt

## 2015-10-12 NOTE — Telephone Encounter (Signed)
Pt called back to schedule appt- botox appt is 10/26

## 2015-10-16 NOTE — Telephone Encounter (Signed)
Noted, thank you Sandi

## 2015-11-01 ENCOUNTER — Ambulatory Visit (INDEPENDENT_AMBULATORY_CARE_PROVIDER_SITE_OTHER): Payer: BC Managed Care – PPO | Admitting: Neurology

## 2015-11-01 VITALS — BP 97/69 | HR 89 | Ht 66.0 in

## 2015-11-01 DIAGNOSIS — G43701 Chronic migraine without aura, not intractable, with status migrainosus: Secondary | ICD-10-CM | POA: Diagnosis not present

## 2015-11-01 NOTE — Progress Notes (Signed)
Botox-100unitsx2 vials Lot: [id number] Expiration: 05/2018 NDC:0023-1145-01 [id number]  0.9% Sodium Chloride- 4mL total [id number] Expiration: 05/2017 NDC: 08657-846-9663323-186-20  *B/B *Dx: E95.284G43.701

## 2015-11-01 NOTE — Progress Notes (Signed)

## 2015-12-05 ENCOUNTER — Ambulatory Visit: Payer: BC Managed Care – PPO | Admitting: Neurology

## 2016-01-28 ENCOUNTER — Telehealth: Payer: Self-pay | Admitting: Neurology

## 2016-01-28 ENCOUNTER — Ambulatory Visit (INDEPENDENT_AMBULATORY_CARE_PROVIDER_SITE_OTHER): Payer: BC Managed Care – PPO | Admitting: Neurology

## 2016-01-28 ENCOUNTER — Encounter: Payer: Self-pay | Admitting: Neurology

## 2016-01-28 VITALS — BP 128/85 | HR 90

## 2016-01-28 DIAGNOSIS — G43701 Chronic migraine without aura, not intractable, with status migrainosus: Secondary | ICD-10-CM | POA: Diagnosis not present

## 2016-01-28 NOTE — Telephone Encounter (Signed)
PT NEEDS 12 WEEK B[facility]

## 2016-01-28 NOTE — Progress Notes (Signed)
Botox-200unitsx1vials Lot: [id number] Expiration: 08/2018 NDC: [id number] [id number]  0.9% Sodium Chloride bacteriostatic- 4mL total Lot: [id number] Expiration: 06/06/2017 NDC: [id number]  Dx: E95.284G43.701 Specialty

## 2016-01-29 NOTE — Progress Notes (Signed)

## 2016-01-31 NOTE — Telephone Encounter (Signed)
Called patient to schedule next injection. She didn't answer so I left a VM asking her to call me back.

## 2016-05-21 IMAGING — MR MR KNEE*L* W/O CM
3 of 6 series · 9 of 40 positions shown · non-contrast
Comparison: None.

CLINICAL DATA: Acute LEFT knee pain since December 2013. Initial
encounter. No discrete injury. Onset of symptoms while walking.
History of instability is a child.

EXAM:
MRI OF THE LEFT KNEE WITHOUT CONTRAST
TECHNIQUE: Multiplanar, multisequence MR imaging of the knee was performed. No
intravenous contrast was administered.

[Series 5: T2 fat-sat · coronal · 4.0mm · 0.21mm/px · 3 of 21 slices shown]
[im 4/21]
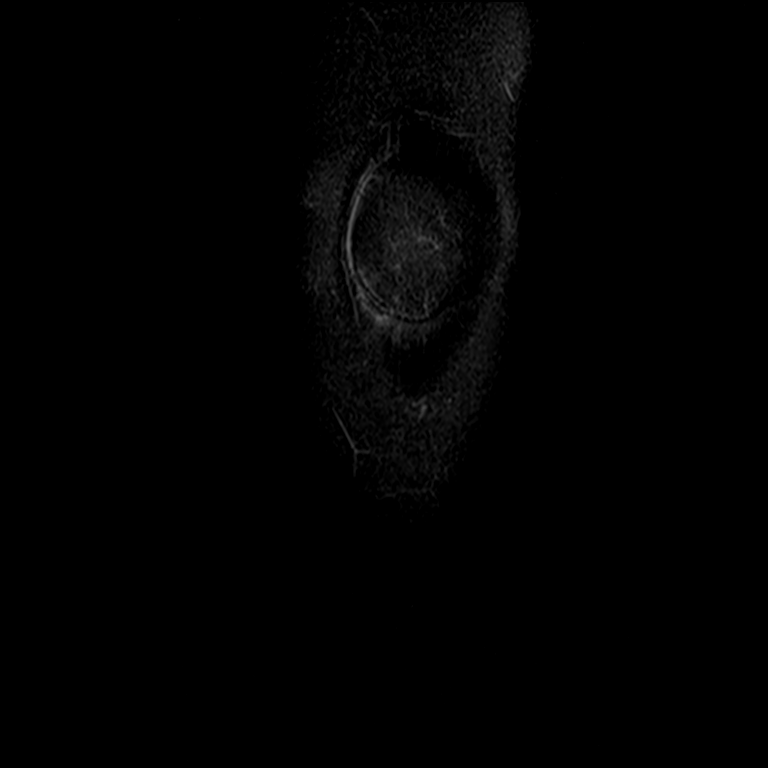
[im 11/21]
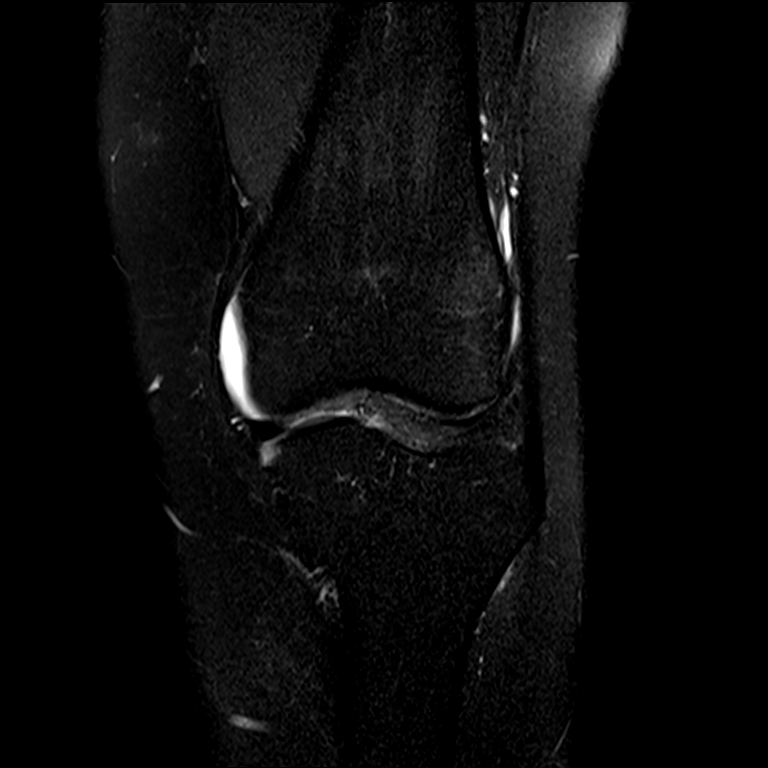
[im 17/21]
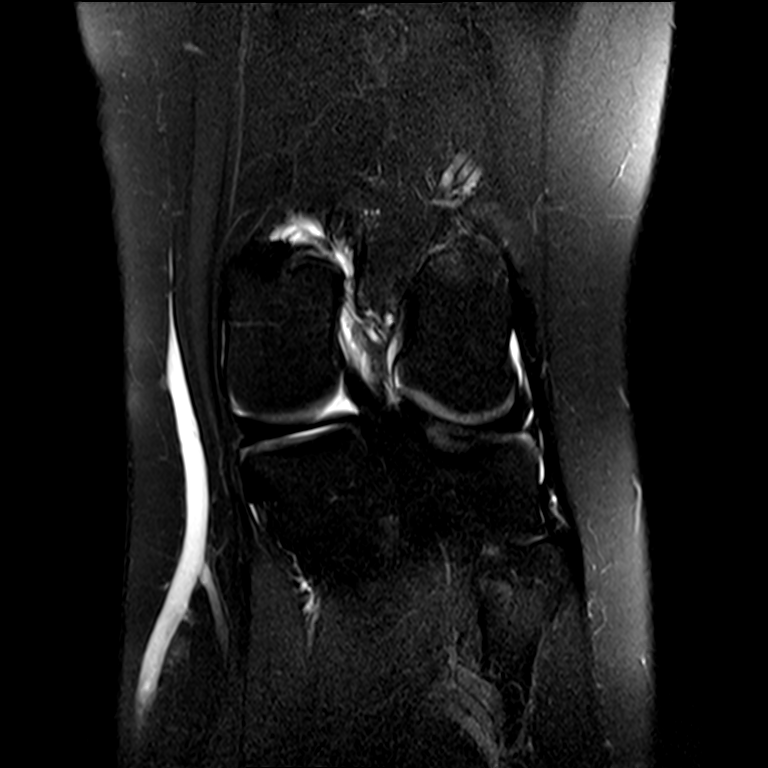

[Series 6: PD fat-sat · sagittal · 4.0mm · 0.21mm/px · 3 of 23 slices shown]
[im 4/23]
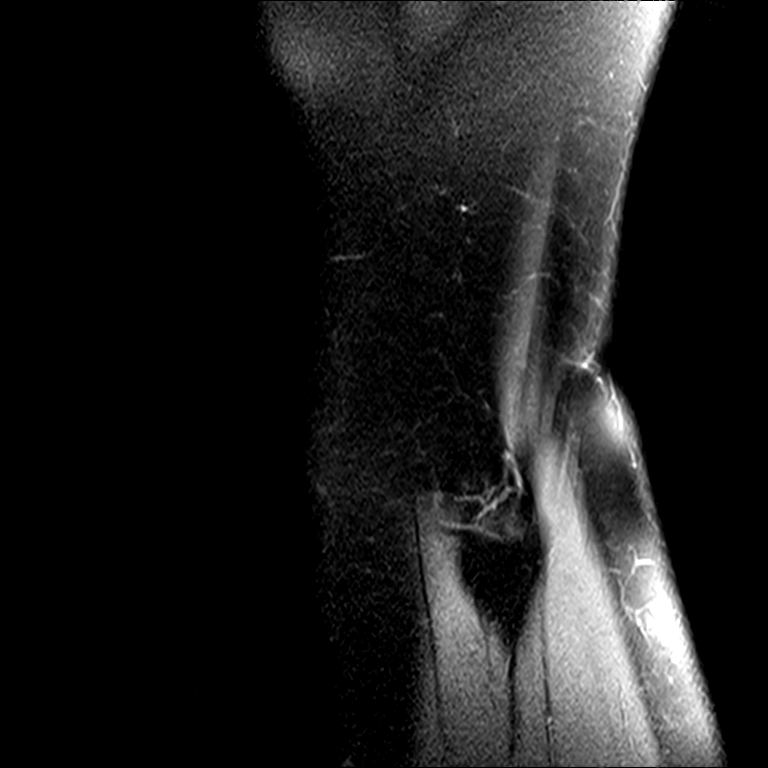
[im 12/23]
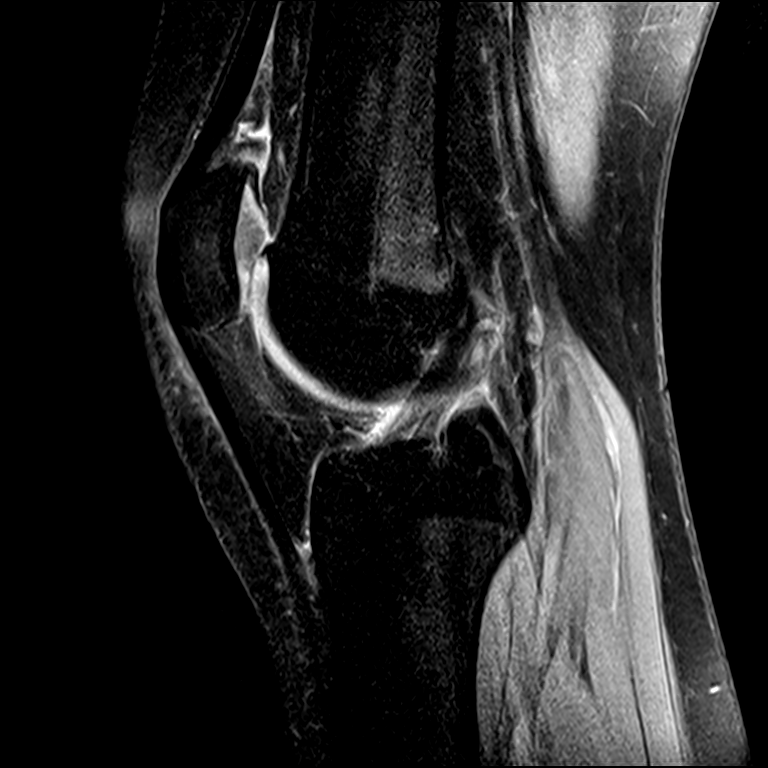
[im 19/23]
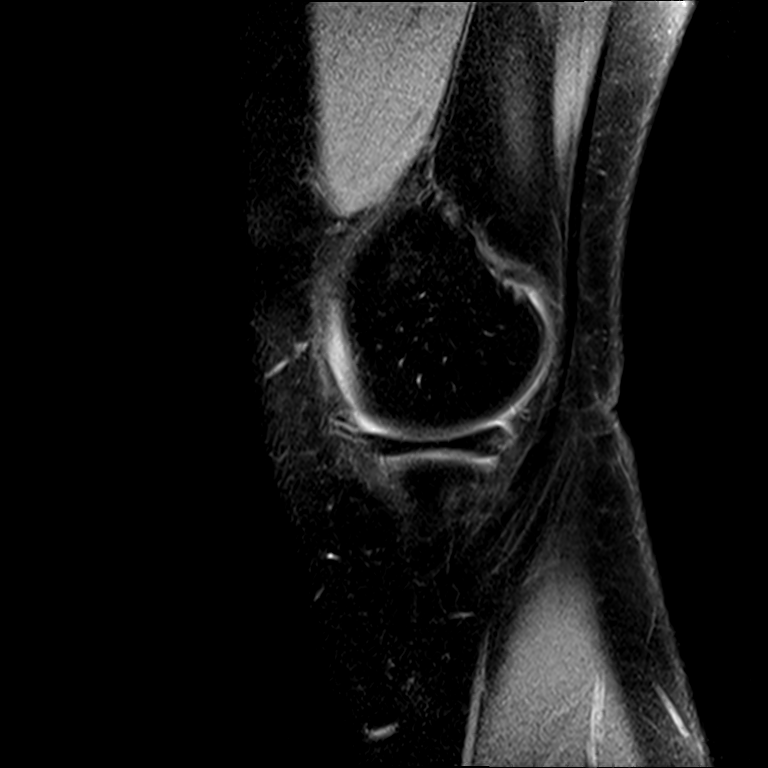

[Series 7: T1 · coronal · 4.0mm · 0.21mm/px · 3 of 21 slices shown]
[im 4/21]
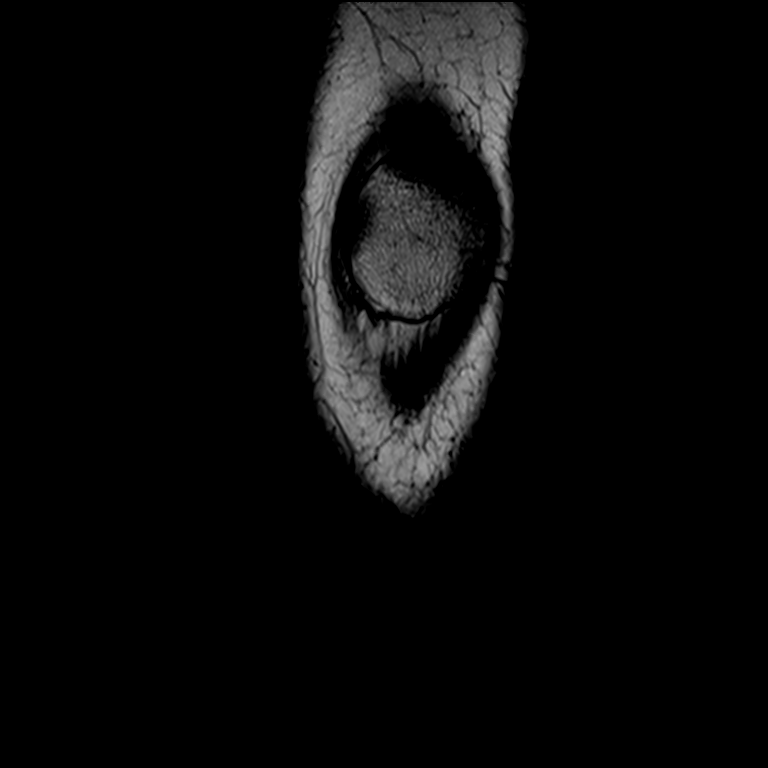
[im 11/21]
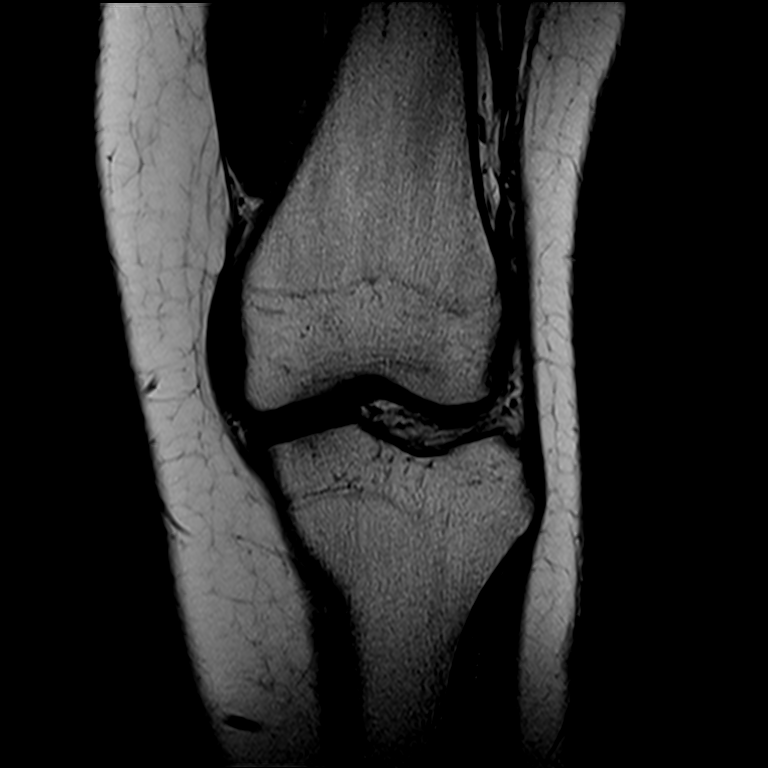
[im 17/21]
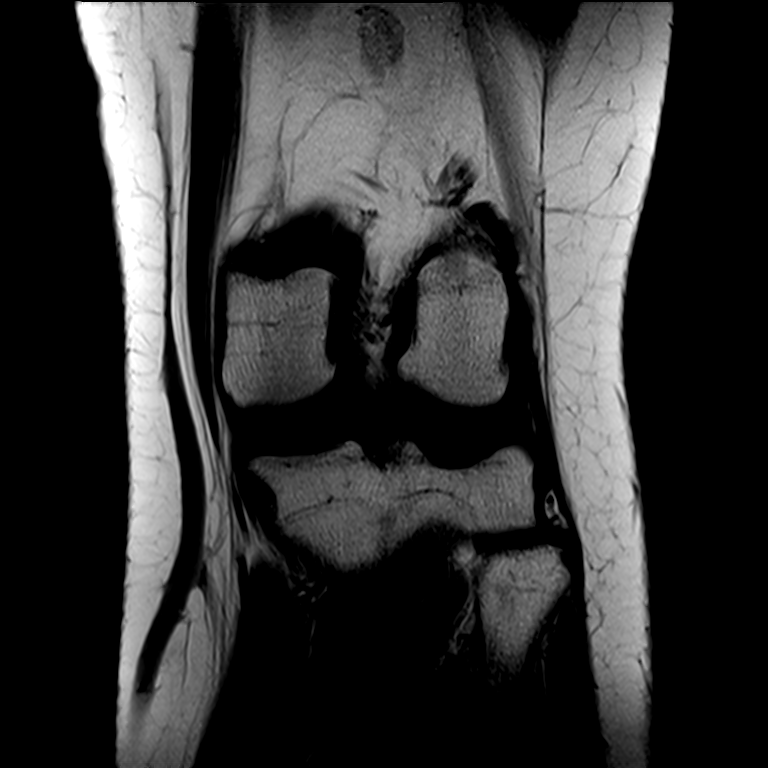

[9 of 40 positions shown; findings below may reference images not displayed]

FINDINGS: MENISCI

Medial meniscus:  Normal.

Lateral meniscus:  Normal.

LIGAMENTS

Cruciates:  Normal.

Collaterals:  Normal.

CARTILAGE

Patellofemoral: Age advanced central chondromalacia patella which is
grade II. Grade II chondral fissuring is present near the apex with
loss of normal cartilage signal. Trochlear cartilage is normal.

Medial:  Normal.

Lateral:  Normal.

Joint:  Small effusion.  No free cartilage fragments are identified.

Popliteal Fossa:  Normal.

Extensor Mechanism: Extensor tendons are intact. The patellofemoral
joint is abnormal, with a dysplastic patella and trochlear
dysplasia. Patella Alta is present with Nyein ratio of 1.8. Type
B trochlear dysplasia is present. Patellar retinacula are intact and
there is no evidence of transient lateral patellar dislocation.
Tibial tuberosity to trochlear groove distance is normal.

Bones:  Normal.
IMPRESSION: 1. Age advanced chondromalacia patella.
2. Constellation of findings predisposing to patellofemoral
instability and patellar tracking disorder. Patella Alta. Trochlear
dysplasia.

## 2016-09-02 ENCOUNTER — Telehealth: Payer: Self-pay | Admitting: Neurology

## 2016-09-02 ENCOUNTER — Encounter: Payer: Self-pay | Admitting: Neurology

## 2016-09-02 ENCOUNTER — Ambulatory Visit (INDEPENDENT_AMBULATORY_CARE_PROVIDER_SITE_OTHER): Payer: BC Managed Care – PPO | Admitting: Neurology

## 2016-09-02 VITALS — BP 102/60 | HR 97 | Ht 66.0 in

## 2016-09-02 DIAGNOSIS — G43701 Chronic migraine without aura, not intractable, with status migrainosus: Secondary | ICD-10-CM | POA: Diagnosis not present

## 2016-09-02 NOTE — Progress Notes (Signed)

## 2016-09-02 NOTE — Telephone Encounter (Signed)
Pt needs botox °

## 2016-09-02 NOTE — Progress Notes (Signed)
Botox-100unitsx2 vials Lot: [id number] Expiration: 03/2019 [id number]  0.9% Sodium Chloride- 82mL total Bacteriostatic Lot:[id number] Expiration: 06/06/2017 NDC: 8832-5498-26  Specialty pharmacy Dx: E15.830

## 2016-09-09 NOTE — Telephone Encounter (Signed)
I called and scheduled the patient for her December injection.

## 2016-12-11 ENCOUNTER — Ambulatory Visit: Payer: BC Managed Care – PPO | Admitting: Neurology

## 2016-12-11 ENCOUNTER — Telehealth: Payer: Self-pay | Admitting: Neurology

## 2016-12-11 ENCOUNTER — Encounter: Payer: Self-pay | Admitting: Neurology

## 2016-12-11 VITALS — BP 115/69 | HR 90

## 2016-12-11 DIAGNOSIS — G43701 Chronic migraine without aura, not intractable, with status migrainosus: Secondary | ICD-10-CM | POA: Diagnosis not present

## 2016-12-11 NOTE — Progress Notes (Signed)
Botox- 100 units x 2 vials Lot: [id number] Expiration: 06/2019 NDC: [id number]  Bacteriostatic 0.9% Sodium Chloride- 4mL total Lot: [id number] Expiration: 05/07/2018 NDC: [id number]  Dx: I69.629G43.701 B/B //BCrn

## 2016-12-11 NOTE — Progress Notes (Signed)

## 2016-12-11 NOTE — Telephone Encounter (Signed)
Per Dr. Ahern, pt needs botox in 3 mos °

## 2016-12-22 NOTE — Telephone Encounter (Signed)
Pt returned Danielle's call °

## 2016-12-22 NOTE — Telephone Encounter (Signed)
I called to schedule the patient for her next injection but she did not answer so I left a VM asking her to call me back.  °

## 2016-12-24 NOTE — Telephone Encounter (Signed)
I called and scheduled the patient for her next injection.  °

## 2017-03-03 ENCOUNTER — Telehealth: Payer: Self-pay | Admitting: Neurology

## 2017-03-03 NOTE — Telephone Encounter (Signed)
There is no formal recommendation but we can definitely delay her botox and see what happens. Does she want to delay botox for a month and see what happens? If she starts getting headaches we can restart quickly.

## 2017-03-03 NOTE — Telephone Encounter (Signed)
I called and spoke with the patient regarding her botox. She says that she read on a botox website that she is supposed to take a break after two years on the botox and see if she is still having headaches. She wants to know if this is something she should try? Please call and advise.

## 2017-03-03 NOTE — Telephone Encounter (Signed)
Pt request call back from [place].  Thank you

## 2017-03-04 NOTE — Telephone Encounter (Signed)
Patient cannot afford copay on botox. Lindsey Fitzgerald wants to know if Lindsey Fitzgerald can keep her apt tomorrow to discuss other options with Dr. Lucia GaskinsAhern (It is a 15 minute apt fyi) please call and advise.

## 2017-03-04 NOTE — Telephone Encounter (Signed)
Patient can keep her appt. However, this slot was for Botox and is only 15 minutes. Dr. Lucia GaskinsAhern will do the best she can to discuss next steps during this visit.   D/w Dr. Lucia GaskinsAhern.

## 2017-03-04 NOTE — Telephone Encounter (Addendum)
Spoke with patient. Botox appt canceled. Patient will discuss what she can with Dr. Lucia GaskinsAhern in office as pt will already be here with her mother.

## 2017-03-04 NOTE — Telephone Encounter (Signed)
Patient would like to continue on her normal schedule.

## 2017-03-05 ENCOUNTER — Ambulatory Visit: Payer: BC Managed Care – PPO | Admitting: Neurology

## 2017-03-05 ENCOUNTER — Telehealth: Payer: Self-pay | Admitting: *Deleted

## 2017-03-05 MED ORDER — FREMANEZUMAB-VFRM 225 MG/1.5ML ~~LOC~~ SOSY: 225.0000 mg | PREFILLED_SYRINGE | SUBCUTANEOUS | 0 refills | Status: DC

## 2017-03-05 MED ORDER — FREMANEZUMAB-VFRM 225 MG/1.5ML ~~LOC~~ SOSY: 225.0000 mg | PREFILLED_SYRINGE | SUBCUTANEOUS | 11 refills | Status: DC

## 2017-03-05 NOTE — Telephone Encounter (Signed)
Discussed Ajovy injection with the patient and gave them instruction sheets from the box. The patient is aware that injection is subcutaneous and should be given every 30 days. Syringes should remain refrigerated until ready for use. Let the syringe sit at room temperature for 30 minutes prior to injection. The patient was also educated on the available sites to rotate each month (abdomen, thigh, and back of upper arm). I discussed proper technique for skin prep prior to injection including to wash hands, cleanse the site inside to outside in a circular motion and allow skin to air dry. The patient was instructed how to pinch the skin, inject the syringe at a 45-90 degree angle, and inject all of the medication. Immediately place the used syringe in the sharps container and do not recap. RN administered medication today in the LUA. The patient tolerated well and verbalized understanding of instructions. She is aware that the next dose will be due on 04/02/17. Pt was given copay card.

## 2017-03-12 ENCOUNTER — Ambulatory Visit: Payer: BC Managed Care – PPO | Admitting: Neurology

## 2017-06-19 IMAGING — CT CT HEAD W/O CM
2 series · 15 of 30 positions shown, 17 images · non-contrast
Comparison: None.

CLINICAL DATA: Severe headache for several days with nausea and
vomiting and photophobia. History of migraines.

EXAM:
CT HEAD WITHOUT CONTRAST
TECHNIQUE: Contiguous axial images were obtained from the base of the skull
through the vertex without intravenous contrast.

[Series 2: head without · axial · non-contrast · 0.42mm/px · z∈[-116,-1]mm · 7 of 31 slices shown, 9 images]
[im 4/31  brain]
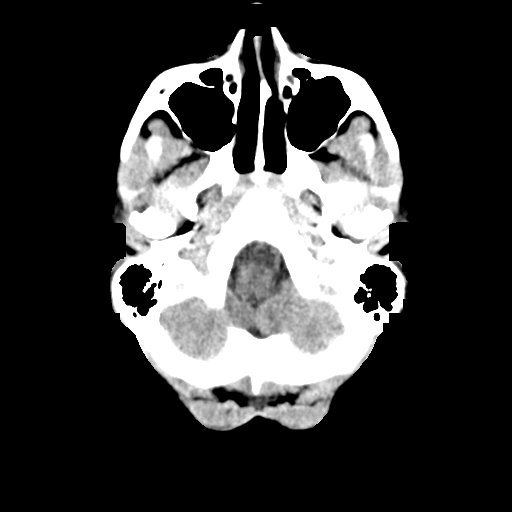
[im 4/31  bone]
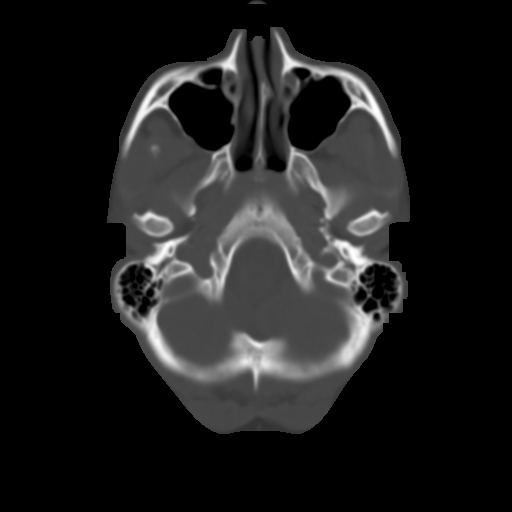
[im 8/31  brain]
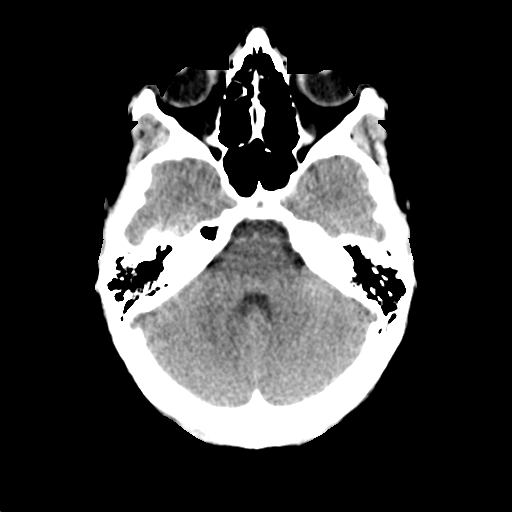
[im 12/31  brain]
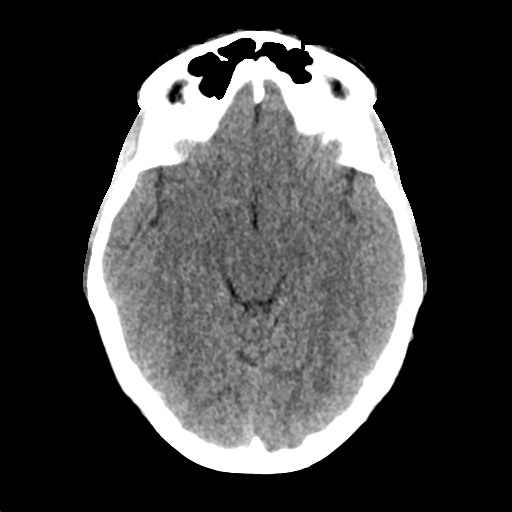
[im 16/31  brain]
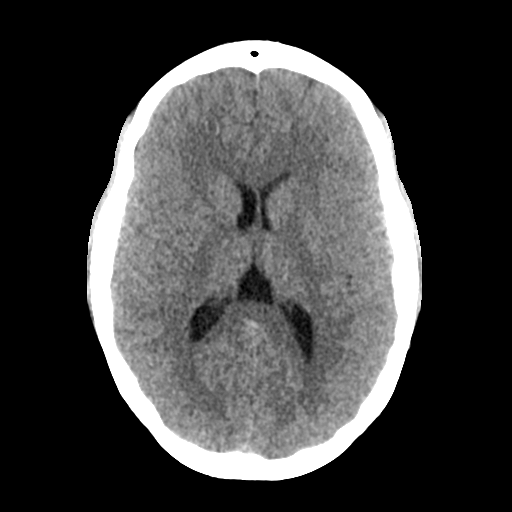
[im 19/31  brain]
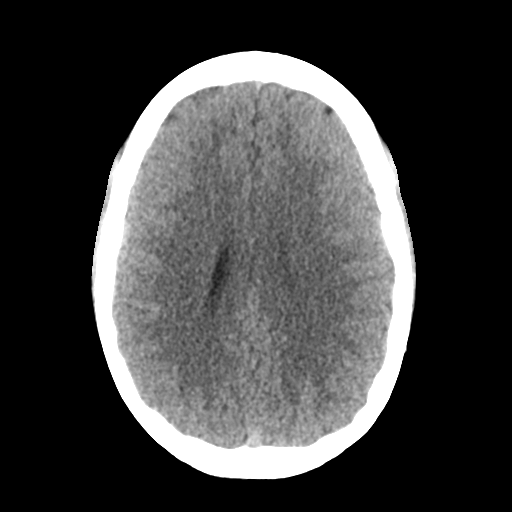
[im 19/31  bone]
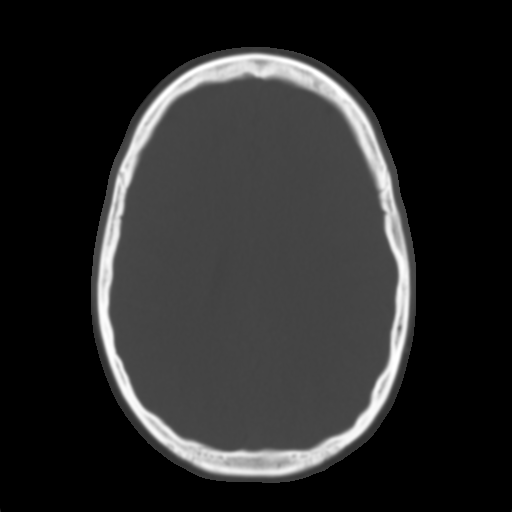
[im 23/31  brain]
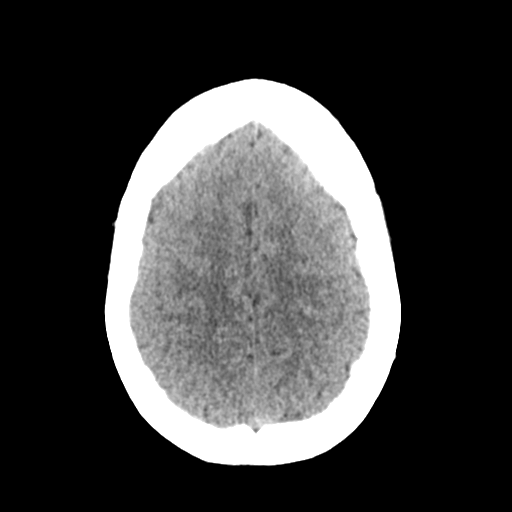
[im 27/31  brain]
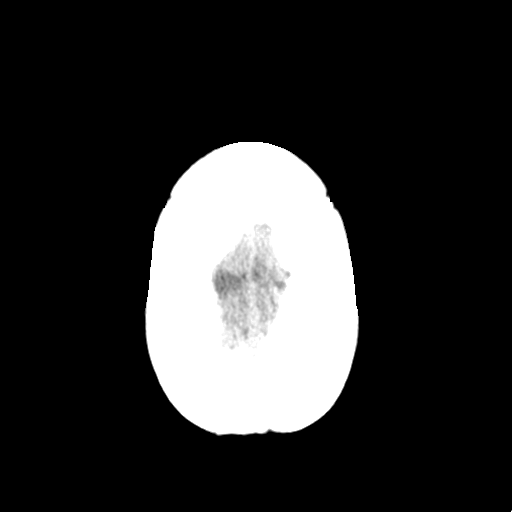

[Series 3: head bone · axial · 0.42mm/px · z∈[-117,+3]mm · 8 of 76 slices shown]
[im 8/76  bone]
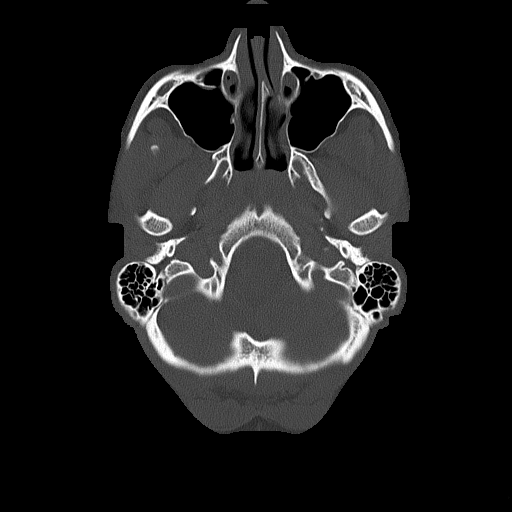
[im 16/76  bone]
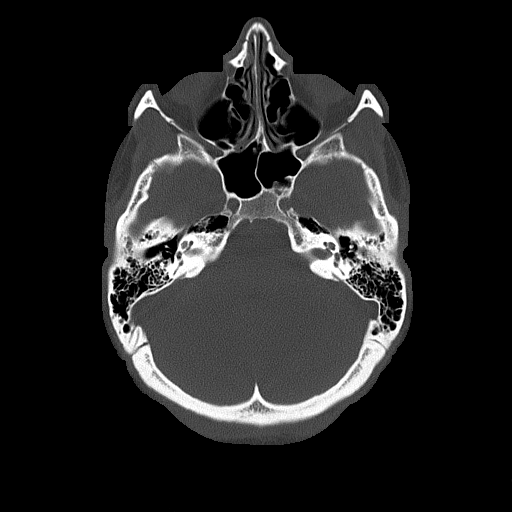
[im 23/76  bone]
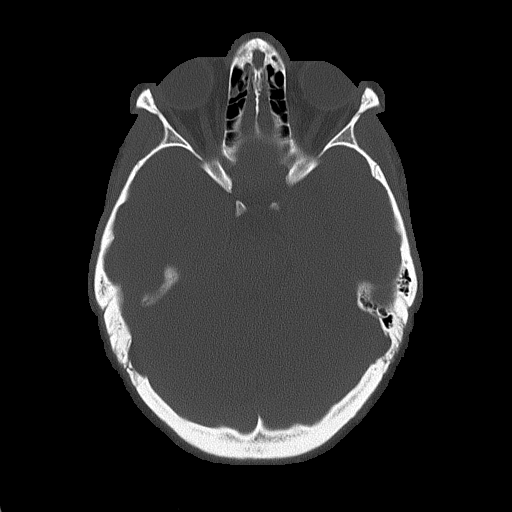
[im 34/76  bone]
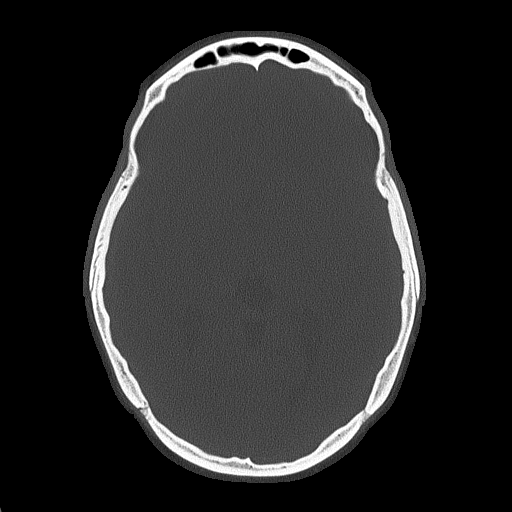
[im 42/76  bone]
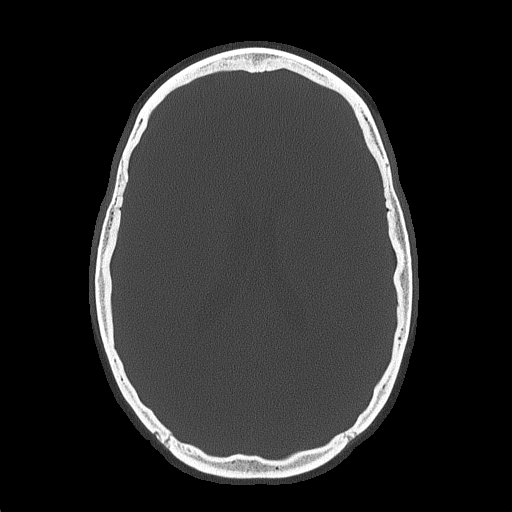
[im 53/76  bone]
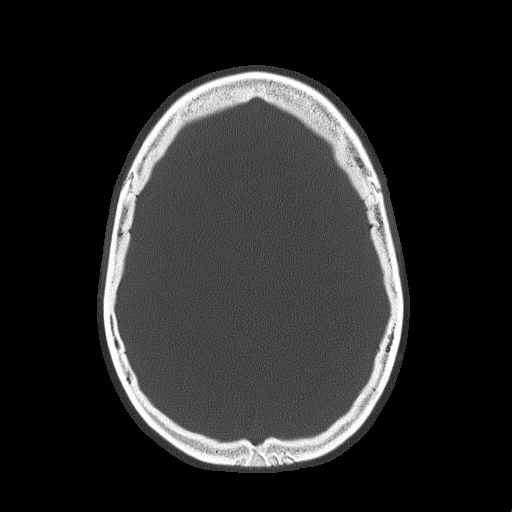
[im 61/76  bone]
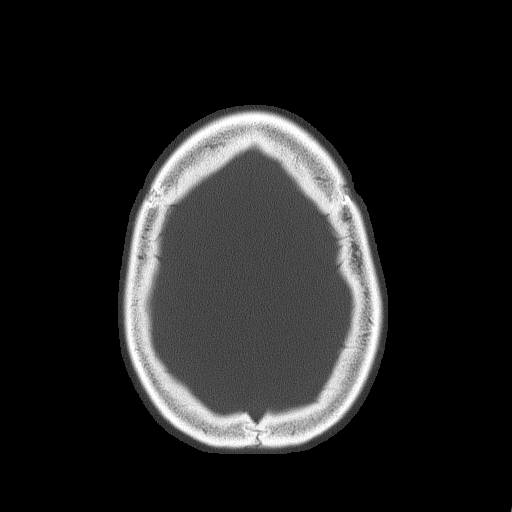
[im 68/76  bone]
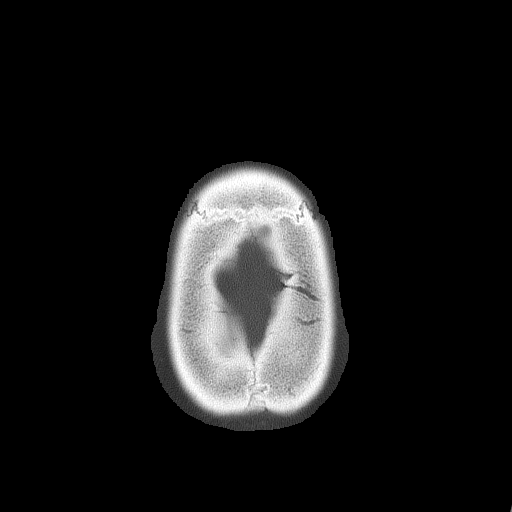

[15 of 30 positions shown; findings below may reference images not displayed]

FINDINGS: The ventricles are normal in size and configuration. There are no
parenchymal masses or mass effect, no evidence of an infarct, no
extra-axial masses or abnormal fluid collections and no intracranial
hemorrhage.

No skull lesion. Mucous retention cyst in left maxillary sinus.
Minor mucosal thickening in the right ethmoid air cells and left
frontal sinus. Clear mastoid air cells.
IMPRESSION: 1. No intracranial abnormality.

## 2017-11-19 ENCOUNTER — Telehealth: Payer: Self-pay | Admitting: Neurology

## 2017-11-19 NOTE — Telephone Encounter (Signed)
Botox letter regarding [facility] Pharm[facility] mailed to patient °

## 2017-12-16 NOTE — Telephone Encounter (Signed)
I called the patient to make her aware pharmacy is pending consent. She did not answer so I left a message asking her to call me back. If she calls back please give her their number at [phone]. DW

## 2017-12-16 NOTE — Telephone Encounter (Signed)
I have called the pharmacy several times today. I spoke with the patient earlier this morning and asked her to call the pharmacy for consent. At 1:00 I called the pharmacy and consent had still not been give. I called the patient and she informed me that she had not called yet but that she would call in five minutes. I called the pharmacy back at 3:00. She still had not given consent. We conferenced her on our call and she stated that her mom was calling to speak with someone. I told her I would call back to the pharmacy in 20 minutes. If there was still no consent we would have to cancel apt for tomorrow.

## 2017-12-17 ENCOUNTER — Telehealth: Payer: Self-pay | Admitting: Neurology

## 2017-12-17 ENCOUNTER — Ambulatory Visit (INDEPENDENT_AMBULATORY_CARE_PROVIDER_SITE_OTHER): Payer: BC Managed Care – PPO | Admitting: Neurology

## 2017-12-17 DIAGNOSIS — R112 Nausea with vomiting, unspecified: Secondary | ICD-10-CM

## 2017-12-17 DIAGNOSIS — G43001 Migraine without aura, not intractable, with status migrainosus: Secondary | ICD-10-CM

## 2017-12-17 DIAGNOSIS — G43709 Chronic migraine without aura, not intractable, without status migrainosus: Secondary | ICD-10-CM | POA: Diagnosis not present

## 2017-12-17 NOTE — Telephone Encounter (Signed)
botox inj °

## 2017-12-17 NOTE — Progress Notes (Signed)
Botox consent signed by patient  Botox- 100 units x 2 vials Lot: [id number] Expiration: 05/2020 NDC: 4540-9811-910023-1145-01  Bacteriostatic 0.9% Sodium Chloride- 4mL total Lot: [id number] Expiration: 10/07/2018 NDC: 6213-0865-780409-1966-02  Dx: [id number] SP

## 2017-12-18 NOTE — Telephone Encounter (Signed)
Apt scheduled. DW

## 2017-12-20 MED ORDER — ONDANSETRON 4 MG PO TBDP: 4.0000 mg | ORAL_TABLET | Freq: Three times a day (TID) | ORAL | 11 refills | Status: DC | PRN

## 2017-12-20 NOTE — Progress Notes (Signed)
° °Consent Form °Botulism Toxin Injection For Chronic Migraine ° ° ° °Reviewed orally with patient, additionally signature is on file: ° °Botulism toxin has been approved by the Federal drug administration for treatment of chronic migraine. Botulism toxin does not cure chronic migraine and it may not be effective in some patients. ° °The administration of botulism toxin is accomplished by injecting a small amount of toxin into the muscles of the neck and head. Dosage must be titrated for each individual. Any benefits resulting from botulism toxin tend to wear off after 3 months with a repeat injection required if benefit is to be maintained. Injections are usually done every 3-4 months with maximum effect peak achieved by about 2 or 3 weeks. Botulism toxin is expensive and you should be sure of what costs you will incur resulting from the injection. ° °The side effects of botulism toxin use for chronic migraine may include: ° ° -Transient, and usually mild, facial weakness with facial injections ° -Transient, and usually mild, head or neck weakness with head/neck injections ° -Reduction or loss of forehead facial animation due to forehead muscle weakness ° -Eyelid drooping ° -Dry eye ° -Pain at the site of injection or bruising at the site of injection ° -Double vision ° -Potential unknown long term risks ° °Contraindications: You should not have Botox if you are pregnant, nursing, allergic to albumin, have an infection, skin condition, or muscle weakness at the site of the injection, or have myasthenia gravis, Lambert-Eaton syndrome, or ALS. ° °It is also possible that as with any injection, there may be an allergic reaction or no effect from the medication. Reduced effectiveness after repeated injections is sometimes seen and rarely infection at the injection site may occur. All care will be taken to prevent these side effects. If therapy is given over a long time, atrophy and wasting in the muscle injected may  occur. Occasionally the patient's become refractory to treatment because they develop antibodies to the toxin. In this event, therapy needs to be modified. ° °I have read the above information and consent to the administration of botulism toxin. ° ° ° °BOTOX PROCEDURE NOTE FOR MIGRAINE HEADACHE ° ° ° °Contraindications and precautions discussed with patient(above). Aseptic procedure was observed and patient tolerated procedure. Procedure performed by Dr. Toni Ahern ° °The condition has existed for more than 6 months, and pt does not have a diagnosis of ALS, Myasthenia Gravis or Lambert-Eaton Syndrome.  Risks and benefits of injections discussed and pt agrees to proceed with the procedure.  Written consent obtained ° °These injections are medically necessary. Pt  receives good benefits from these injections. These injections do not cause sedations or hallucinations which the oral therapies may cause. ° °Description of procedure: ° °The patient was placed in a sitting position. The standard protocol was used for Botox as follows, with 5 units of Botox injected at each site: ° ° °-Procerus muscle, midline injection ° °-Corrugator muscle, bilateral injection ° °-Frontalis muscle, bilateral injection, with 2 sites each side, medial injection was performed in the upper one third of the frontalis muscle, in the region vertical from the medial inferior edge of the superior orbital rim. The lateral injection was again in the upper one third of the forehead vertically above the lateral limbus of the cornea, 1.5 cm lateral to the medial injection site. ° °- Levator Scapulae: 5 units bilaterally ° °-Temporalis muscle injection, 5 sites, bilaterally. The first injection was 3 cm above the tragus of the ear,   second injection site was 1.5 cm to 3 cm up from the first injection site in line with the tragus of the ear. The third injection site was 1.5-3 cm forward between the first 2 injection sites. The fourth injection site was  1.5 cm posterior to the second injection site. 5th site laterally in the temporalis  muscleat the level of the outer canthus. ° °- Patient feels her clenching is a trigger for headaches. +5 units masseter bilaterally  ° °- Patient feels the migraines are centered around the eyes +5 units bilaterally at the outer canthus in the orbicularis occuli ° °-Occipitalis muscle injection, 3 sites, bilaterally. The first injection was done one half way between the occipital protuberance and the tip of the mastoid process behind the ear. The second injection site was done lateral and superior to the first, 1 fingerbreadth from the first injection. The third injection site was 1 fingerbreadth superiorly and medially from the first injection site. ° °-Cervical paraspinal muscle injection, 2 sites, bilateral knee first injection site was 1 cm from the midline of the cervical spine, 3 cm inferior to the lower border of the occipital protuberance. The second injection site was 1.5 cm superiorly and laterally to the first injection site. ° °-Trapezius muscle injection was performed at 3 sites, bilaterally. The first injection site was in the upper trapezius muscle halfway between the inflection point of the neck, and the acromion. The second injection site was one half way between the acromion and the first injection site. The third injection was done between the first injection site and the inflection point of the neck. ° ° °Will return for repeat injection in 3 months. ° ° °A 200 unit sof Botox was used, any Botox not injected was wasted. The patient tolerated the procedure well, there were no complications of the above procedure. ° ° °

## 2018-03-05 ENCOUNTER — Telehealth: Payer: Self-pay | Admitting: Neurology

## 2018-03-05 NOTE — Telephone Encounter (Signed)
Pt had good results with cambia, request refill sent to [facility].

## 2018-03-05 NOTE — Telephone Encounter (Signed)
I advised pt to address this refill with Dr. Lucia Gaskins during her appt on 03/30/18 since this med has been d/c. Pt was agreeable to waiting until her appt to discuss this medication with MD.

## 2018-03-05 NOTE — Telephone Encounter (Signed)
Patient got new insurance [employer] [id number].

## 2018-03-22 ENCOUNTER — Ambulatory Visit: Payer: BC Managed Care – PPO | Admitting: Neurology

## 2018-03-24 ENCOUNTER — Telehealth: Payer: Self-pay | Admitting: Neurology

## 2018-03-24 NOTE — Telephone Encounter (Signed)
I called [employer] to start the authorization she was a new case to them so I gave [place] with [employer] all the information and then I spoke to Sidon the pharmacist and did a verbal order. It is pending.

## 2018-03-24 NOTE — Telephone Encounter (Signed)
Correction patient does have it go through [facility]. [facility] I spoke to Roxelle the pharmacist and she asked for the member ID, group # & Bin # I informed her that I do not have the bin # because I do not have a copy of her card. She stated that it will have to go through insurance verification. She stated she could not advice the copay  Cost since it has to be verify with the insurance. She stated if they can get everything verify they can have a delivery for 03/26/18.

## 2018-03-24 NOTE — Telephone Encounter (Signed)
Called Lindsey Fitzgerald and asked for her insurance information she relayed to well I already gave to [place]. I relayed to patient no problem [name] can start her process when she return's because I didn't have it. Patient was fine with this .  (Old information still in the system )

## 2018-03-25 NOTE — Telephone Encounter (Signed)
Patient has to call her insurance member service's because they have her listed as a female . I called insurance and tried to change insurance would not let me . I called patient and relayed to her and gave her the reference # [id number] where I called . After patient changes her demographic's she will call me back.  I will start back process with delivery . Thanks Annabelle Harman .

## 2018-03-25 NOTE — Telephone Encounter (Signed)
Patient called me back and change her status on her demographics but this is a process I will keep checking .

## 2018-03-26 NOTE — Telephone Encounter (Signed)
[facility] and they are still verifying benefits [facility] is [facility] to [facility] complete buy Monday 03/29/2018. Please call [facility] back on Monday to Verify. I have called patient and told her about the update and told her next step is consent and co pay and please be looking out for any funny numbers because it could be [facility] calling. I relayed the worst her apt would have to be rescheduled for another time . Patient understood. Patient really want's to come with her mother on 03/240/2020.

## 2018-03-29 ENCOUNTER — Telehealth: Payer: Self-pay | Admitting: Neurology

## 2018-03-29 ENCOUNTER — Other Ambulatory Visit: Payer: Self-pay | Admitting: *Deleted

## 2018-03-29 MED ORDER — DICLOFENAC POTASSIUM(MIGRAINE) 50 MG PO PACK: PACK | ORAL | 0 refills | Status: DC

## 2018-03-29 NOTE — Telephone Encounter (Signed)
Spoke with pt and advised that Cambia refill was sent to [facility] in [place] and they would be in touch with her regarding next steps. Advised they have a discount program in place and will handle any prior authorizations that her insurance may require. She verbalized understanding and appreciation.

## 2018-03-29 NOTE — Telephone Encounter (Signed)
Cambia order sent to [facility].

## 2018-03-29 NOTE — Telephone Encounter (Signed)
I spoke with patient today to cancel apt due to Coronavirus protocol. Will continue working on getting medication.

## 2018-03-29 NOTE — Telephone Encounter (Signed)
Please prescribe, cant remembr if we still go through [facility]?

## 2018-03-29 NOTE — Telephone Encounter (Signed)
I called and spoke with the patient to make them aware not to come to their upcoming Botox apt due to our new office policy for coronavirus prevention. I told her we would call back to get her on the schedule shortly. She would like to know if she can have a script for Cambia called in to hold her over until she gets back in the office. Marland Kitchen

## 2018-03-30 ENCOUNTER — Ambulatory Visit: Payer: BC Managed Care – PPO | Admitting: Neurology

## 2018-03-31 NOTE — Telephone Encounter (Signed)
Botox authorization form has been sent to the office, asked Toma Copier RN to complete clinical questions and obtain Doctors signature, she has agreed to fax once completed.

## 2018-04-05 NOTE — Telephone Encounter (Signed)
Pt has called back to inform that she has spoken with [facility] [place], [place] - [address]  And they have not received the request for the script on the Diclofenac Potassium (CAMBIA) 50 MG PACK

## 2018-04-06 ENCOUNTER — Other Ambulatory Visit: Payer: Self-pay | Admitting: *Deleted

## 2018-04-06 MED ORDER — DICLOFENAC POTASSIUM(MIGRAINE) 50 MG PO PACK: PACK | ORAL | 0 refills | Status: DC

## 2018-04-06 NOTE — Telephone Encounter (Signed)
Per [place], pt stated she is now only having 5-6 migraine days per month with botox. She also said she was no longer on Ajovy. PA form completed signed and faxed to [facility]. Received a receipt of confirmation.

## 2018-04-06 NOTE — Telephone Encounter (Signed)
Pharmacy changed to [facility] [address] in [place].

## 2018-04-06 NOTE — Telephone Encounter (Signed)
Attempted to call the patient and to ask her a couple of questions about her Botox to fill out her PA form. I left a voicemail asking her to return my call. Office number was provided.

## 2018-04-06 NOTE — Telephone Encounter (Signed)
I was able to speak with the patient and she was able to answer the questions.

## 2018-04-06 NOTE — Telephone Encounter (Addendum)
Called [facility] @ [phone] and LVM with pt's name, dob, phone # and canceled the Cambia 50 mg PO pack. I advised that prescription has been sent to another [facility]. Left office number for call back if they have any questions.

## 2018-04-07 NOTE — Telephone Encounter (Signed)
Noted, thank you

## 2018-04-14 NOTE — Telephone Encounter (Signed)
Received fax from [facility]. Lindsey Fitzgerald has been denied (completed by [facility]). [facility] has a discount program for Cambia, so pt will be able to still get the medication.    Reference #: [id number]

## 2018-05-18 ENCOUNTER — Telehealth: Payer: Self-pay | Admitting: Neurology

## 2018-05-18 NOTE — Telephone Encounter (Signed)
I called and scheduled medication for delivery for 05/20/18. DW

## 2018-05-18 NOTE — Telephone Encounter (Signed)
I called to schedule the patient but she did not answer so I left a VM asking her to call me back. DW  °

## 2018-05-24 ENCOUNTER — Other Ambulatory Visit: Payer: Self-pay

## 2018-05-24 ENCOUNTER — Encounter: Payer: Self-pay | Admitting: Adult Health

## 2018-05-24 ENCOUNTER — Ambulatory Visit (INDEPENDENT_AMBULATORY_CARE_PROVIDER_SITE_OTHER): Payer: BLUE CROSS/BLUE SHIELD | Admitting: Adult Health

## 2018-05-24 VITALS — Temp 97.7°F

## 2018-05-24 DIAGNOSIS — G43709 Chronic migraine without aura, not intractable, without status migrainosus: Secondary | ICD-10-CM | POA: Diagnosis not present

## 2018-05-24 NOTE — Progress Notes (Signed)
       BOTOX PROCEDURE NOTE FOR MIGRAINE HEADACHE    Contraindications and precautions discussed with patient(above). Aseptic procedure was observed and patient tolerated procedure. Procedure performed by Butch Penny, NP  The condition has existed for more than 6 months, and pt does not have a diagnosis of ALS, Myasthenia Gravis or Lambert-Eaton Syndrome.  Risks and benefits of injections discussed and pt agrees to proceed with the procedure.  Written consent obtained  These injections are medically necessary. He receives good benefits from these injections. She has approximately 6 headaches a month.  these injections do not cause sedations or hallucinations which the oral therapies may cause.  Indication/Diagnosis: chronic migraine BOTOX(J0585) injection was performed according to protocol by [employer]. 200 units of BOTOX was dissolved into 4 cc NS.   NDC: [address]-46  Type of toxin: Botox just 1 1% he has  BOTOX 2 vials B/B  NDC 2703-5009-38 right LOT # [id number] exp 11/2020.   Description of procedure:  The patient was placed in a sitting position. The standard protocol was used for Botox as follows, with 5 units of Botox injected at each site:   -Procerus muscle, midline injection  -Corrugator muscle, bilateral injection  -Frontalis muscle, bilateral injection, with 2 sites each side, medial injection was performed in the upper one third of the frontalis muscle, in the region vertical from the medial inferior edge of the superior orbital rim. The lateral injection was again in the upper one third of the forehead vertically above the lateral limbus of the cornea, 1.5 cm lateral to the medial injection site.  -Temporalis muscle injection, 4 sites, bilaterally. The first injection was 3 cm above the tragus of the ear, second injection site was 1.5 cm to 3 cm up from the first injection site in line with the tragus of the ear. The third injection site was 1.5-3 cm forward between  the first 2 injection sites. The fourth injection site was 1.5 cm posterior to the second injection site.  -Occipitalis muscle injection, 3 sites, bilaterally. The first injection was done one half way between the occipital protuberance and the tip of the mastoid process behind the ear. The second injection site was done lateral and superior to the first, 1 fingerbreadth from the first injection. The third injection site was 1 fingerbreadth superiorly and medially from the first injection site.  -Cervical paraspinal muscle injection, 2 sites, bilateral knee first injection site was 1 cm from the midline of the cervical spine, 3 cm inferior to the lower border of the occipital protuberance. The second injection site was 1.5 cm superiorly and laterally to the first injection site.  -Trapezius muscle injection was performed at 3 sites, bilaterally. The first injection site was in the upper trapezius muscle halfway between the inflection point of the neck, and the acromion. The second injection site was one half way between the acromion and the first injection site. The third injection was done between the first injection site and the inflection point of the neck.   Will return for repeat injection in 3 months.   A 200 unit sof Botox was used, 155 units were injected, the rest of the Botox was wasted. The patient tolerated the procedure well, there were no complications of the above procedure.  Butch Penny, MSN, NP-C 05/24/2018, 1:56 PM [facility] [address] [place], [place] [address] [phone]

## 2018-05-24 NOTE — Progress Notes (Signed)
BOTOX 2 vials B/B  NDC 9678-9381-01 LOT # [id number] exp 11/2020.

## 2018-07-06 ENCOUNTER — Telehealth: Payer: Self-pay | Admitting: Neurology

## 2018-08-05 NOTE — Telephone Encounter (Signed)
I called the patient x3. Lindsey Fitzgerald

## 2018-08-24 ENCOUNTER — Other Ambulatory Visit: Payer: Self-pay

## 2018-08-24 ENCOUNTER — Ambulatory Visit (INDEPENDENT_AMBULATORY_CARE_PROVIDER_SITE_OTHER): Payer: BC Managed Care – PPO | Admitting: Neurology

## 2018-08-24 VITALS — Temp 98.6°F

## 2018-08-24 DIAGNOSIS — L659 Nonscarring hair loss, unspecified: Secondary | ICD-10-CM

## 2018-08-24 DIAGNOSIS — G43709 Chronic migraine without aura, not intractable, without status migrainosus: Secondary | ICD-10-CM

## 2018-08-24 DIAGNOSIS — L989 Disorder of the skin and subcutaneous tissue, unspecified: Secondary | ICD-10-CM

## 2018-08-24 DIAGNOSIS — R21 Rash and other nonspecific skin eruption: Secondary | ICD-10-CM

## 2018-08-24 NOTE — Progress Notes (Signed)
Botox- 200 units x 1 vial Lot: [id number] Expiration: 03/2021 NDC: 4696-2952-84  Bacteriostatic 0.9% Sodium Chloride- 79mL total Lot: [id number] Expiration: 10/07/2018 NDC: 1027-2536-64  Dx: [id number] S/P

## 2018-08-24 NOTE — Progress Notes (Signed)
Consent Form Botulism Toxin Injection For Chronic Migraine  Interval history 08/24/2018: last botox was in May with Ward Givens.  Dermatology referral for alopecia notice today at botox injections, patient has also noticed patches of lost hair, Also getting nodules int he fingers I will order several rheum labs but needs to follow up with pcp.  Orders Placed This Encounter  Procedures  . Rheumatoid factor  . Sjogren's syndrome antibods(ssa + ssb)  . ANA Comprehensive Panel  . ANA,IFA RA Diag Pnl w/rflx Tit/Patn  . Pan-ANCA  . Sedimentation rate  . FANA Staining Patterns  . Ambulatory referral to Dermatology     +masseters,+temples. Not LS, not OO  Reviewed orally with patient, additionally signature is on file:  Botulism toxin has been approved by the Federal drug administration for treatment of chronic migraine. Botulism toxin does not cure chronic migraine and it may not be effective in some patients.  The administration of botulism toxin is accomplished by injecting a small amount of toxin into the muscles of the neck and head. Dosage must be titrated for each individual. Any benefits resulting from botulism toxin tend to wear off after 3 months with a repeat injection required if benefit is to be maintained. Injections are usually done every 3-4 months with maximum effect peak achieved by about 2 or 3 weeks. Botulism toxin is expensive and you should be sure of what costs you will incur resulting from the injection.  The side effects of botulism toxin use for chronic migraine may include:   -Transient, and usually mild, facial weakness with facial injections  -Transient, and usually mild, head or neck weakness with head/neck injections  -Reduction or loss of forehead facial animation due to forehead muscle weakness  -Eyelid drooping  -Dry eye  -Pain at the site of injection or bruising at the site of injection  -Double vision  -Potential unknown long term risks   Contraindications: You should not have Botox if you are pregnant, nursing, allergic to albumin, have an infection, skin condition, or muscle weakness at the site of the injection, or have myasthenia gravis, Lambert-Eaton syndrome, or ALS.  It is also possible that as with any injection, there may be an allergic reaction or no effect from the medication. Reduced effectiveness after repeated injections is sometimes seen and rarely infection at the injection site may occur. All care will be taken to prevent these side effects. If therapy is given over a long time, atrophy and wasting in the muscle injected may occur. Occasionally the patient's become refractory to treatment because they develop antibodies to the toxin. In this event, therapy needs to be modified.  I have read the above information and consent to the administration of botulism toxin.    BOTOX PROCEDURE NOTE FOR MIGRAINE HEADACHE    Contraindications and precautions discussed with patient(above). Aseptic procedure was observed and patient tolerated procedure. Procedure performed by Dr. Georgia Dom  The condition has existed for more than 6 months, and pt does not have a diagnosis of ALS, Myasthenia Gravis or Lambert-Eaton Syndrome.  Risks and benefits of injections discussed and pt agrees to proceed with the procedure.  Written consent obtained  These injections are medically necessary. Pt  receives good benefits from these injections. These injections do not cause sedations or hallucinations which the oral therapies may cause.  Description of procedure:  The patient was placed in a sitting position. The standard protocol was used for Botox as follows, with 5 units of Botox injected at  each site:   -Procerus muscle, midline injection  -Corrugator muscle, bilateral injection  -Frontalis muscle, bilateral injection, with 2 sites each side, medial injection was performed in the upper one third of the frontalis muscle, in the  region vertical from the medial inferior edge of the superior orbital rim. The lateral injection was again in the upper one third of the forehead vertically above the lateral limbus of the cornea, 1.5 cm lateral to the medial injection site.  - Levator Scapulae: 5 units bilaterally  -Temporalis muscle injection, 5 sites, bilaterally. The first injection was 3 cm above the tragus of the ear, second injection site was 1.5 cm to 3 cm up from the first injection site in line with the tragus of the ear. The third injection site was 1.5-3 cm forward between the first 2 injection sites. The fourth injection site was 1.5 cm posterior to the second injection site. 5th site laterally in the temporalis  muscleat the level of the outer canthus.  - Patient feels her clenching is a trigger for headaches. +5 units masseter bilaterally   - Patient feels the migraines are centered around the eyes +5 units bilaterally at the outer canthus in the orbicularis occuli  -Occipitalis muscle injection, 3 sites, bilaterally. The first injection was done one half way between the occipital protuberance and the tip of the mastoid process behind the ear. The second injection site was done lateral and superior to the first, 1 fingerbreadth from the first injection. The third injection site was 1 fingerbreadth superiorly and medially from the first injection site.  -Cervical paraspinal muscle injection, 2 sites, bilateral knee first injection site was 1 cm from the midline of the cervical spine, 3 cm inferior to the lower border of the occipital protuberance. The second injection site was 1.5 cm superiorly and laterally to the first injection site.  -Trapezius muscle injection was performed at 3 sites, bilaterally. The first injection site was in the upper trapezius muscle halfway between the inflection point of the neck, and the acromion. The second injection site was one half way between the acromion and the first injection site.  The third injection was done between the first injection site and the inflection point of the neck.   Will return for repeat injection in 3 months.   A 200 unit sof Botox was used, any Botox not injected was wasted. The patient tolerated the procedure well, there were no complications of the above procedure.

## 2018-08-26 LAB — ANA COMPREHENSIVE PANEL
Anti JO-1: <0.2 AI (ref 0.0–0.9)
Centromere Ab Screen: <0.2 AI (ref 0.0–0.9)
Chromatin Ab SerPl-aCnc: <0.2 AI (ref 0.0–0.9)
ENA RNP Ab: <0.2 AI (ref 0.0–0.9)
ENA SM Ab Ser-aCnc: <0.2 AI (ref 0.0–0.9)
ENA SSA (RO) Ab: <0.2 AI (ref 0.0–0.9)
ENA SSB (LA) Ab: <0.2 AI (ref 0.0–0.9)
Scleroderma (Scl-70) (ENA) Antibody, IgG: <0.2 AI (ref 0.0–0.9)
dsDNA Ab: 1 IU/mL (ref 0–9)

## 2018-08-26 LAB — PAN-ANCA
ANCA Proteinase 3: <3.5 U/mL (ref 0.0–3.5)
Atypical pANCA: <1:20 {titer}
C-ANCA: <1:20 {titer}
Myeloperoxidase Ab: <9 U/mL (ref 0.0–9.0)
P-ANCA: <1:20 {titer}

## 2018-08-26 LAB — SEDIMENTATION RATE: Sed Rate: 2 mm/hr (ref 0–32)

## 2018-08-26 LAB — ANA,IFA RA DIAG PNL W/RFLX TIT/PATN
ANA Titer 1: POSITIVE — AB
Cyclic Citrullin Peptide Ab: 4 units (ref 0–19)
Rhuematoid fact SerPl-aCnc: <10 IU/mL (ref 0.0–13.9)

## 2018-08-26 LAB — FANA STAINING PATTERNS
Homogeneous Pattern: 1:160 {titer} — ABNORMAL HIGH
Speckled Pattern: 1:160 {titer} — ABNORMAL HIGH

## 2018-08-30 ENCOUNTER — Other Ambulatory Visit: Payer: Self-pay | Admitting: Neurology

## 2018-08-30 DIAGNOSIS — R768 Other specified abnormal immunological findings in serum: Secondary | ICD-10-CM

## 2018-08-30 DIAGNOSIS — L659 Nonscarring hair loss, unspecified: Secondary | ICD-10-CM

## 2018-08-30 DIAGNOSIS — L989 Disorder of the skin and subcutaneous tissue, unspecified: Secondary | ICD-10-CM

## 2018-09-22 NOTE — Progress Notes (Signed)
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   History of Present Illness: Lindsey Fitzgerald is a 23 y.o. female seen in consultation per request of Dr. Lavell Anchors.  According to patient in October 2019 her hairdresser noticed a bald patch in the back of her head.  She changed her hair style and noticed improvement since then.  She states in August 2020 she noticed that she was having increased hair loss and she was watching her hair and noticed another small bald spot spot on top of her head which is gradually increased in size.  She states she does not wear her hair up or tie her hair anymore.  Also noticed intermittent rash on her fingertips which she brought pictures on the iPhone.  She does not have any lesions currently.  She states her primary care thought it was eczema and gave her steroid cream.  She states in 2017, she started having discomfort in her knee joints.  At the time she was evaluated by orthopedics.  She was told that she had narrowing between the kneecap and the knee.  She also has intermittent popping of her left patella.  She has discomfort climbing stairs.  She has been having some discomfort in her right shoulder recently.  She denies any history of injury.  She has intermittent diarrhea and constipation.  She denies any history of blood in her stool.  She states she was tested for celiac which was negative.  There is positive family history of celiac disease in her sister and systemic lupus in her mother.  Activities of Daily Living:  Patient reports morning stiffness for 2 minutes.   Patient Denies nocturnal pain.  Difficulty dressing/grooming: Denies Difficulty climbing stairs: Reports Difficulty getting out of chair:  Denies Difficulty using hands for taps, buttons, cutlery, and/or writing: Denies  Review of Systems  Constitutional: Positive for fatigue. Negative for night sweats, weight gain and weight loss.  HENT: Negative for mouth sores, trouble swallowing, trouble swallowing, mouth dryness and nose dryness.   Eyes: Positive for dryness. Negative for pain, redness and visual disturbance.       Wears contacts  Respiratory: Negative for cough, shortness of breath and difficulty breathing.   Cardiovascular: Negative for chest pain, palpitations, hypertension, irregular heartbeat and swelling in legs/feet.  Gastrointestinal: Positive for constipation and diarrhea. Negative for blood in stool.  Endocrine: Negative for increased urination.  Genitourinary: Negative for vaginal dryness.  Musculoskeletal: Positive for arthralgias, joint pain and morning stiffness. Negative for joint swelling, myalgias, muscle weakness, muscle tenderness and myalgias.  Skin: Positive for hair loss and sensitivity to sunlight. Negative for color change, rash, skin tightness and ulcers.  Allergic/Immunologic: Negative for susceptible to infections.  Neurological: Negative for dizziness, memory loss, night sweats and weakness.  Hematological: Negative for swollen glands.  Psychiatric/Behavioral: Positive for sleep disturbance. Negative for depressed mood. The patient is nervous/anxious.     PMFS History:  Patient Active Problem List   Diagnosis Date Noted  . Chronic migraine without aura with status migrainosus, not intractable 02/12/2015  . Migraine headache 08/26/2013  . Easy bruising 08/26/2013  .  Thirst 08/26/2013    Past Medical History:  Diagnosis Date  . Migraine   . Premature baby   . Tension pneumothorax     Family History  Problem Relation Age of Onset  . Diabetes Mother   . Hyperlipidemia Mother   . Hypertension Mother   . Migraines Mother   . Fibromyalgia Mother   . Migraines Father   . Other Father         Brain Tumor  . Stroke Maternal Grandmother   . Lupus Maternal Grandmother   . Stroke Maternal Grandfather   . Hypertension Maternal Grandfather   . Celiac disease Sister   . Diabetes Paternal Uncle    Past Surgical History:  Procedure Laterality Date  . nexplanon      insertion 04-13-14  . None    . TYMPANOSTOMY TUBE PLACEMENT    . WISDOM TOOTH EXTRACTION     Social History   Social History Narrative   Patient is single with no children.   Patient is right handed.   Patient has a high school education and currently in college.   Patient drinks caffeine occasionally.   Immunization History  Administered Date(s) Administered  . Tdap 05/06/2013     Objective: Vital Signs: BP 131/82 (BP Location: Left Arm, Patient Position: Sitting, Cuff Size: Normal)   Pulse (!) 106   Resp 12   Ht 5\' 6"  (1.676 m)   Wt 155 lb 6.4 oz (70.5 kg)   BMI 25.08 kg/m    Physical Exam Vitals signs and nursing note reviewed.  Constitutional:      Appearance: She is well-developed.  HENT:     Head: Normocephalic and atraumatic.  Eyes:     Conjunctiva/sclera: Conjunctivae normal.  Neck:     Musculoskeletal: Normal range of motion.  Cardiovascular:     Rate and Rhythm: Normal rate and regular rhythm.     Heart sounds: Normal heart sounds.  Pulmonary:     Effort: Pulmonary effort is normal.     Breath sounds: Normal breath sounds.  Abdominal:     General: Bowel sounds are normal.     Palpations: Abdomen is soft.  Lymphadenopathy:     Cervical: No cervical adenopathy.  Skin:    General: Skin is warm and dry.     Capillary Refill: Capillary refill takes less than 2 seconds.     Comments: She has a patch of alopecia on the right parietal region of her scalp.  Neurological:     Mental Status: She is alert and oriented to person, place, and time.  Psychiatric:        Behavior: Behavior normal.      Musculoskeletal Exam: C-spine thoracic and lumbar spine with good range of motion.   Shoulder joints, elbow joints, wrist joints, MCPs PIPs and DIPs with good range of motion.  She is crepitus in her knee joints but no warmth swelling or effusion was noted.  Ankle joints, MCPs PIPs and DIPs with good range of motion with no synovitis.  CDAI Exam: CDAI Score: - Patient Global: -; Provider Global: - Swollen: -; Tender: - Joint Exam   No joint exam has been documented for this visit   There is currently no information documented on the homunculus. Go to the Rheumatology activity and complete the homunculus joint exam.  Investigation: Findings:  08/24/18: ANA 1:160 homogeneous, 1:160 speckled, sed rate 2, RF<10, CCP 4, ANCA negative, ENA negative   Component     Latest Ref Rng &  Units 08/24/2018  dsDNA Ab     0 - 9 IU/mL 1  ENA RNP Ab     0.0 - 0.9 AI <0.2  ENA SM Ab Ser-aCnc     0.0 - 0.9 AI <0.2  Scleroderma (Scl-70) (ENA) Antibody, IgG     0.0 - 0.9 AI <0.2  ENA SSA (RO) Ab     0.0 - 0.9 AI <0.2  ENA SSB (LA) Ab     0.0 - 0.9 AI <0.2  Chromatin Ab SerPl-aCnc     0.0 - 0.9 AI <0.2  Anti JO-1     0.0 - 0.9 AI <0.2  CENTROMERE AB SCREEN     0.0 - 0.9 AI <0.2  SEE BELOW      Comment  Myeloperoxidase Ab     0.0 - 9.0 U/mL <9.0  ANCA Proteinase 3     0.0 - 3.5 U/mL <3.5  Cytoplasmic (C-ANCA)     Neg:<1:20 titer <1:20  P-ANCA     Neg:<1:20 titer <1:20  Atypical pANCA     Neg:<1:20 titer <1:20  ANA Titer 1      Positive (A)  RA Latex Turbid.     0.0 - 13.9 IU/mL <10.0  Cyclic Citrullin Peptide Ab     0 - 19 units 4  Homogeneous Pattern      1:160 (H)  Speckled Pattern      1:160 (H)  NOTE:      Comment  Sed Rate     0 - 32 mm/hr 2   Imaging: No results found.  Recent Labs: Lab Results  Component Value Date   WBC 6.6 08/26/2013   HGB 14.1 08/26/2013   PLT 326 08/26/2013   NA 138 08/26/2013   K 3.9 08/26/2013   CL 105 08/26/2013   CO2 23 08/26/2013   GLUCOSE 84 08/26/2013   BUN 8 08/26/2013   CREATININE 0.65 08/26/2013   BILITOT <0.2  08/26/2013   ALKPHOS 59 08/26/2013   AST 11 08/26/2013   ALT 6 08/26/2013   PROT 7.1 08/26/2013   ALBUMIN 4.6 08/26/2013   CALCIUM 9.0 08/26/2013   GFRAA 149 08/26/2013    Speciality Comments: No specialty comments available.  Procedures:  No procedures performed Allergies: Mushroom extract complex, Other, Ketorolac tromethamine, Sumatriptan, and Asparagus   Assessment / Plan:     Visit Diagnoses: Alopecia-she has a patch of alopecia on her scalp.  She had a previous patch in the occipital region.  The base is mildly erythematous.  The differential diagnosis will be alopecia areata versus infection versus autoimmune condition like discoid lupus.  [facility]  His skin biopsy will be helpful.  Skin lesions-patient showed me some pictures on her iPhone which appears like dyshidrotic eczema on her fingertips.  It is resolved now.  Patient states she used some topical steroid cream.  Positive ANA (antinuclear antibody) - 08/24/18: ANA 1:160 homogeneous, 1:160 speckled, sed rate 2, RF<10, CCP 4, ANCA negative, ENA negative there is positive family history of lupus in her maternal grandmother.  It is not unusual to see positive ANA in the family members.  At this time she has no other clinical features of lupus.  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  She has off-and-on discomfort with climbing stairs.  Have advised her to do some lower extremity muscle strengthening exercises.  A handout on exercises was given.  Chronic migraine without aura with status migrainosus, not  intractable-followed by Dr. Daisy Blossom.  Hyperalgesia-patient states she has had hyperalgesia for a while.  She also gives history of chronic insomnia and fatigue.  I discussed possible association with myofascial pain.  Need for regular exercise and good sleep hygiene was discussed at length.  Primary insomnia-good sleep hygiene was discussed.  Use of melatonin and chamomile tea was also discussed.  Other fatigue - Plan: CK, TSH, CANCELED: VITAMIN D 25 Hydroxy (Vit-D Deficiency, Fractures) it is not covered by her insurance.  Easy bruising  Family history of systemic lupus erythematosus - Maternal grandmother  Family history of celiac disease - Sister.  Patient has history of intermittent diarrhea and constipation.  It is possible that she may have IBS.  She states she has been tested for celiac disease in the past which was negative.  Orders: Orders Placed This Encounter  Procedures  . CBC with Differential/Platelet  . COMPLETE METABOLIC PANEL WITH GFR  . Urinalysis, Routine w reflex microscopic  . CK  . TSH  . C3 and C4   No orders of the defined types were placed in this encounter.   Face-to-face time spent with patient was 45 minutes. Greater than 50% of time was spent in counseling and coordination of care.  Follow-Up Instructions: Return for Hair loss, positive ANA.   Pollyann Savoy, MD  Note - This record has been created using Animal nutritionist.  Chart creation errors have been sought, but may not always  have been located. Such creation errors do not reflect on  the standard of medical care.

## 2018-10-04 ENCOUNTER — Other Ambulatory Visit: Payer: Self-pay

## 2018-10-04 ENCOUNTER — Other Ambulatory Visit: Payer: Self-pay | Admitting: Neurology

## 2018-10-04 MED ORDER — CAMBIA 50 MG PO PACK: PACK | ORAL | 3 refills | Status: DC

## 2018-10-04 NOTE — Telephone Encounter (Signed)
Pt changed insurance and now has a Soil scientist and she is needed a script for her Diclofenac Potassium (CAMBIA) 50 MG PACK and Prior Auth form sent to [place] - [id number]

## 2018-10-04 NOTE — Telephone Encounter (Signed)
I called pt about need a refill for cambia and PA needed to be done. She wanted a new rx sent to [facility]. I stated we have to receive notification that the medication requires a PA once the refill is sent. PT was on her moms insurance. She still has [employer] but is on her own plan. I stated refill was sent to [facility] and if it needs a PA we will complete it.Pt verbalized understanding.

## 2018-10-05 ENCOUNTER — Encounter: Payer: Self-pay | Admitting: Rheumatology

## 2018-10-05 ENCOUNTER — Other Ambulatory Visit: Payer: Self-pay

## 2018-10-05 ENCOUNTER — Ambulatory Visit (INDEPENDENT_AMBULATORY_CARE_PROVIDER_SITE_OTHER): Payer: BC Managed Care – PPO | Admitting: Rheumatology

## 2018-10-05 VITALS — BP 131/82 | HR 106 | Resp 12 | Ht 66.0 in | Wt 155.4 lb

## 2018-10-05 DIAGNOSIS — L989 Disorder of the skin and subcutaneous tissue, unspecified: Secondary | ICD-10-CM

## 2018-10-05 DIAGNOSIS — F5101 Primary insomnia: Secondary | ICD-10-CM | POA: Insufficient documentation

## 2018-10-05 DIAGNOSIS — M25561 Pain in right knee: Secondary | ICD-10-CM

## 2018-10-05 DIAGNOSIS — Z8269 Family history of other diseases of the musculoskeletal system and connective tissue: Secondary | ICD-10-CM

## 2018-10-05 DIAGNOSIS — L659 Nonscarring hair loss, unspecified: Secondary | ICD-10-CM | POA: Diagnosis not present

## 2018-10-05 DIAGNOSIS — R768 Other specified abnormal immunological findings in serum: Secondary | ICD-10-CM

## 2018-10-05 DIAGNOSIS — R238 Other skin changes: Secondary | ICD-10-CM

## 2018-10-05 DIAGNOSIS — M25562 Pain in left knee: Secondary | ICD-10-CM

## 2018-10-05 DIAGNOSIS — R5383 Other fatigue: Secondary | ICD-10-CM

## 2018-10-05 DIAGNOSIS — R208 Other disturbances of skin sensation: Secondary | ICD-10-CM

## 2018-10-05 DIAGNOSIS — R233 Spontaneous ecchymoses: Secondary | ICD-10-CM

## 2018-10-05 DIAGNOSIS — G43701 Chronic migraine without aura, not intractable, with status migrainosus: Secondary | ICD-10-CM

## 2018-10-05 DIAGNOSIS — Z8379 Family history of other diseases of the digestive system: Secondary | ICD-10-CM

## 2018-10-05 DIAGNOSIS — G8929 Other chronic pain: Secondary | ICD-10-CM

## 2018-10-05 NOTE — Patient Instructions (Signed)
Journal for Nurse Practitioners, 15(4), 263-267. Retrieved October 12, 2017 from [email or web address].com/[email or web address]">  Knee Exercises Ask your health care provider which exercises are safe for you. Do exercises exactly as told by your health care provider and adjust them as directed. It is normal to feel mild stretching, pulling, tightness, or discomfort as you do these exercises. Stop right away if you feel sudden pain or your pain gets worse. Do not begin these exercises until told by your health care provider. Stretching and range-of-motion exercises These exercises warm up your muscles and joints and improve the movement and flexibility of your knee. These exercises also help to relieve pain and swelling. Knee extension, prone 1. Lie on your abdomen (prone position) on a bed. 2. Place your left / right knee just beyond the edge of the surface so your knee is not on the bed. You can put a towel under your left / right thigh just above your kneecap for comfort. 3. Relax your leg muscles and allow gravity to straighten your knee (extension). You should feel a stretch behind your left / right knee. 4. Hold this position for __________ seconds. 5. Scoot up so your knee is supported between repetitions. Repeat __________ times. Complete this exercise __________ times a day. Knee flexion, active  1. Lie on your back with both legs straight. If this causes back discomfort, bend your left / right knee so your foot is flat on the floor. 2. Slowly slide your left / right heel back toward your buttocks. Stop when you feel a gentle stretch in the front of your knee or thigh (flexion). 3. Hold this position for __________ seconds. 4. Slowly slide your left / right heel back to the starting position. Repeat __________ times. Complete this exercise __________ times a day. Quadriceps stretch, prone  1. Lie on your abdomen on a firm surface, such as a bed or padded floor. 2. Bend your left / right knee and hold  your ankle. If you cannot reach your ankle or pant leg, loop a belt around your foot and grab the belt instead. 3. Gently pull your heel toward your buttocks. Your knee should not slide out to the side. You should feel a stretch in the front of your thigh and knee (quadriceps). 4. Hold this position for __________ seconds. Repeat __________ times. Complete this exercise __________ times a day. Hamstring, supine 1. Lie on your back (supine position). 2. Loop a belt or towel over the ball of your left / right foot. The ball of your foot is on the walking surface, right under your toes. 3. Straighten your left / right knee and slowly pull on the belt to raise your leg until you feel a gentle stretch behind your knee (hamstring). ? Do not let your knee bend while you do this. ? Keep your other leg flat on the floor. 4. Hold this position for __________ seconds. Repeat __________ times. Complete this exercise __________ times a day. Strengthening exercises These exercises build strength and endurance in your knee. Endurance is the ability to use your muscles for a long time, even after they get tired. Quadriceps, isometric This exercise stretches the muscles in front of your thigh (quadriceps) without moving your knee joint (isometric). 1. Lie on your back with your left / right leg extended and your other knee bent. Put a rolled towel or small pillow under your knee if told by your health care provider. 2. Slowly tense the muscles in the front of your left /   right thigh. You should see your kneecap slide up toward your hip or see increased dimpling just above the knee. This motion will push the back of the knee toward the floor. 3. For __________ seconds, hold the muscle as tight as you can without increasing your pain. 4. Relax the muscles slowly and completely. Repeat __________ times. Complete this exercise __________ times a day. Straight leg raises This exercise stretches the muscles in front  of your thigh (quadriceps) and the muscles that move your hips (hip flexors). 1. Lie on your back with your left / right leg extended and your other knee bent. 2. Tense the muscles in the front of your left / right thigh. You should see your kneecap slide up or see increased dimpling just above the knee. Your thigh may even shake a bit. 3. Keep these muscles tight as you raise your leg 4-6 inches (10-15 cm) off the floor. Do not let your knee bend. 4. Hold this position for __________ seconds. 5. Keep these muscles tense as you lower your leg. 6. Relax your muscles slowly and completely after each repetition. Repeat __________ times. Complete this exercise __________ times a day. Hamstring, isometric 1. Lie on your back on a firm surface. 2. Bend your left / right knee about __________ degrees. 3. Dig your left / right heel into the surface as if you are trying to pull it toward your buttocks. Tighten the muscles in the back of your thighs (hamstring) to "dig" as hard as you can without increasing any pain. 4. Hold this position for __________ seconds. 5. Release the tension gradually and allow your muscles to relax completely for __________ seconds after each repetition. Repeat __________ times. Complete this exercise __________ times a day. Hamstring curls If told by your health care provider, do this exercise while wearing ankle weights. Begin with __________ lb weights. Then increase the weight by 1 lb (0.5 kg) increments. Do not wear ankle weights that are more than __________ lb. 1. Lie on your abdomen with your legs straight. 2. Bend your left / right knee as far as you can without feeling pain. Keep your hips flat against the floor. 3. Hold this position for __________ seconds. 4. Slowly lower your leg to the starting position. Repeat __________ times. Complete this exercise __________ times a day. Squats This exercise strengthens the muscles in front of your thigh and knee  (quadriceps). 1. Stand in front of a table, with your feet and knees pointing straight ahead. You may rest your hands on the table for balance but not for support. 2. Slowly bend your knees and lower your hips like you are going to sit in a chair. ? Keep your weight over your heels, not over your toes. ? Keep your lower legs upright so they are parallel with the table legs. ? Do not let your hips go lower than your knees. ? Do not bend lower than told by your health care provider. ? If your knee pain increases, do not bend as low. 3. Hold the squat position for __________ seconds. 4. Slowly push with your legs to return to standing. Do not use your hands to pull yourself to standing. Repeat __________ times. Complete this exercise __________ times a day. Wall slides This exercise strengthens the muscles in front of your thigh and knee (quadriceps). 1. Lean your back against a smooth wall or door, and walk your feet out 18-24 inches (46-61 cm) from it. 2. Place your feet hip-width apart. 3.   Slowly slide down the wall or door until your knees bend __________ degrees. Keep your knees over your heels, not over your toes. Keep your knees in line with your hips. 4. Hold this position for __________ seconds. Repeat __________ times. Complete this exercise __________ times a day. Straight leg raises This exercise strengthens the muscles that rotate the leg at the hip and move it away from your body (hip abductors). 1. Lie on your side with your left / right leg in the top position. Lie so your head, shoulder, knee, and hip line up. You may bend your bottom knee to help you keep your balance. 2. Roll your hips slightly forward so your hips are stacked directly over each other and your left / right knee is facing forward. 3. Leading with your heel, lift your top leg 4-6 inches (10-15 cm). You should feel the muscles in your outer hip lifting. ? Do not let your foot drift forward. ? Do not let your knee  roll toward the ceiling. 4. Hold this position for __________ seconds. 5. Slowly return your leg to the starting position. 6. Let your muscles relax completely after each repetition. Repeat __________ times. Complete this exercise __________ times a day. Straight leg raises This exercise stretches the muscles that move your hips away from the front of the pelvis (hip extensors). 1. Lie on your abdomen on a firm surface. You can put a pillow under your hips if that is more comfortable. 2. Tense the muscles in your buttocks and lift your left / right leg about 4-6 inches (10-15 cm). Keep your knee straight as you lift your leg. 3. Hold this position for __________ seconds. 4. Slowly lower your leg to the starting position. 5. Let your leg relax completely after each repetition. Repeat __________ times. Complete this exercise __________ times a day. This information is not intended to replace advice given to you by your health care provider. Make sure you discuss any questions you have with your health care provider. Document Released: 11/06/2004 Document Revised: 10/13/2017 Document Reviewed: 10/13/2017 Elsevier Patient Education  2020 [employer]  

## 2018-10-06 LAB — CBC WITH DIFFERENTIAL/PLATELET
Absolute Monocytes: 367 cells/uL (ref 200–950)
Basophils Absolute: 78 cells/uL (ref 0–200)
Basophils Relative: 1 %
Eosinophils Absolute: 70 cells/uL (ref 15–500)
Eosinophils Relative: 0.9 %
HCT: 43.2 % (ref 35.0–45.0)
Hemoglobin: 14.6 g/dL (ref 11.7–15.5)
Lymphs Abs: 1732 cells/uL (ref 850–3900)
MCH: 31.1 pg (ref 27.0–33.0)
MCHC: 33.8 g/dL (ref 32.0–36.0)
MCV: 91.9 fL (ref 80.0–100.0)
MPV: 9.7 fL (ref 7.5–12.5)
Monocytes Relative: 4.7 %
Neutro Abs: 5554 cells/uL (ref 1500–7800)
Neutrophils Relative %: 71.2 %
Platelets: 385 10*3/uL (ref 140–400)
RBC: 4.7 10*6/uL (ref 3.80–5.10)
RDW: 11.9 % (ref 11.0–15.0)
Total Lymphocyte: 22.2 %
WBC: 7.8 10*3/uL (ref 3.8–10.8)

## 2018-10-06 LAB — URINALYSIS, ROUTINE W REFLEX MICROSCOPIC
Bilirubin Urine: NEGATIVE
Glucose, UA: NEGATIVE
Hgb urine dipstick: NEGATIVE
Ketones, ur: NEGATIVE
Leukocytes,Ua: NEGATIVE
Nitrite: NEGATIVE
Protein, ur: NEGATIVE
Specific Gravity, Urine: 1.031 (ref 1.001–1.03)
pH: <=5 (ref 5.0–8.0)

## 2018-10-06 LAB — C3 AND C4
C3 Complement: 172 mg/dL (ref 83–193)
C4 Complement: 31 mg/dL (ref 15–57)

## 2018-10-06 LAB — TSH: TSH: 0.69 mIU/L

## 2018-10-06 LAB — COMPLETE METABOLIC PANEL WITH GFR
AG Ratio: 1.7 (calc) (ref 1.0–2.5)
ALT: 9 U/L (ref 6–29)
AST: 16 U/L (ref 10–30)
Albumin: 4.3 g/dL (ref 3.6–5.1)
Alkaline phosphatase (APISO): 43 U/L (ref 31–125)
BUN: 13 mg/dL (ref 7–25)
CO2: 25 mmol/L (ref 20–32)
Calcium: 9.1 mg/dL (ref 8.6–10.2)
Chloride: 105 mmol/L (ref 98–110)
Creat: 0.73 mg/dL (ref 0.50–1.10)
GFR, Est African American: 134 mL/min/{1.73_m2} (ref >=60)
GFR, Est Non African American: 115 mL/min/{1.73_m2} (ref >=60)
Globulin: 2.5 g/dL (calc) (ref 1.9–3.7)
Glucose, Bld: 111 mg/dL — ABNORMAL HIGH (ref 65–99)
Potassium: 4 mmol/L (ref 3.5–5.3)
Sodium: 140 mmol/L (ref 135–146)
Total Bilirubin: 0.3 mg/dL (ref 0.2–1.2)
Total Protein: 6.8 g/dL (ref 6.1–8.1)

## 2018-10-06 LAB — CK: Total CK: 99 U/L (ref 29–143)

## 2018-10-06 LAB — SEDIMENTATION RATE: Sed Rate: 9 mm/h (ref 0–20)

## 2018-10-06 NOTE — Progress Notes (Signed)
Labs are within normal limits.  I will discuss results at the follow-up visit.

## 2018-10-25 ENCOUNTER — Ambulatory Visit: Payer: Self-pay | Admitting: Rheumatology

## 2018-11-16 ENCOUNTER — Telehealth: Payer: Self-pay

## 2018-11-16 NOTE — Telephone Encounter (Signed)
I called [facility] at [phone] and they stated the first available date was 11/17. DW

## 2018-11-17 NOTE — Progress Notes (Deleted)
Office Visit Note  Patient: Lindsey Fitzgerald             Date of Birth: 12-30-1995           MRN: [id number]             PCP: Patient, No Pcp Per Referring: No ref. provider found Visit Date: 12/01/2018 Occupation: @GUAROCC @  Subjective:  No chief complaint on file.   History of Present Illness: Lindsey Fitzgerald is a 23 y.o. female ***   Activities of Daily Living:  Patient reports morning stiffness for *** {minute/hour:19697}.   Patient {ACTIONS;DENIES/REPORTS:21021675::"Denies"} nocturnal pain.  Difficulty dressing/grooming: {ACTIONS;DENIES/REPORTS:21021675::"Denies"} Difficulty climbing stairs: {ACTIONS;DENIES/REPORTS:21021675::"Denies"} Difficulty getting out of chair: {ACTIONS;DENIES/REPORTS:21021675::"Denies"} Difficulty using hands for taps, buttons, cutlery, and/or writing: {ACTIONS;DENIES/REPORTS:21021675::"Denies"}  No Rheumatology ROS completed.   PMFS History:  Patient Active Problem List   Diagnosis Date Noted  . Family history of systemic lupus erythematosus 10/05/2018  . Family history of celiac disease 10/05/2018  . Primary insomnia 10/05/2018  . Chronic pain of both knees 10/05/2018  . Chronic migraine without aura with status migrainosus, not intractable 02/12/2015  . Migraine headache 08/26/2013  . Easy bruising 08/26/2013  . Thirst 08/26/2013    Past Medical History:  Diagnosis Date  . Migraine   . Premature baby   . Tension pneumothorax     Family History  Problem Relation Age of Onset  . Diabetes Mother   . Hyperlipidemia Mother   . Hypertension Mother   . Migraines Mother   . Fibromyalgia Mother   . Migraines Father   . Other Father        Brain Tumor  . Stroke Maternal Grandmother   . Lupus Maternal Grandmother   . Stroke Maternal Grandfather   . Hypertension Maternal Grandfather   . Celiac disease Sister   . Diabetes Paternal Uncle    Past Surgical History:  Procedure Laterality Date  . nexplanon      insertion 04-13-14  .  None    . TYMPANOSTOMY TUBE PLACEMENT    . WISDOM TOOTH EXTRACTION     Social History   Social History Narrative   Patient is single with no children.   Patient is right handed.   Patient has a high school education and currently in college.   Patient drinks caffeine occasionally.   Immunization History  Administered Date(s) Administered  . Tdap 05/06/2013     Objective: Vital Signs: There were no vitals taken for this visit.   Physical Exam   Musculoskeletal Exam: ***  CDAI Exam: CDAI Score: - Patient Global: -; Provider Global: - Swollen: -; Tender: - Joint Exam   No joint exam has been documented for this visit   There is currently no information documented on the homunculus. Go to the Rheumatology activity and complete the homunculus joint exam.  Investigation: No additional findings.  Imaging: No results found.  Recent Labs: Lab Results  Component Value Date   WBC 7.8 10/05/2018   HGB 14.6 10/05/2018   PLT 385 10/05/2018   NA 140 10/05/2018   K 4.0 10/05/2018   CL 105 10/05/2018   CO2 25 10/05/2018   GLUCOSE 111 (H) 10/05/2018   BUN 13 10/05/2018   CREATININE 0.73 10/05/2018   BILITOT 0.3 10/05/2018   ALKPHOS 59 08/26/2013   AST 16 10/05/2018   ALT 9 10/05/2018   PROT 6.8 10/05/2018   ALBUMIN 4.6 08/26/2013   CALCIUM 9.1 10/05/2018   GFRAA 134 10/05/2018  October 05, 2018 C3-C4 normal,  CK 99, TSH normal, sed rate 9  08/24/18: ANA 1:160 homogeneous, 1:160 speckled, sed rate 2, RF<10, CCP 4, ANCA negative, ENA negative   Speciality Comments: No specialty comments available.  Procedures:  No procedures performed Allergies: Mushroom extract complex, Other, Ketorolac tromethamine, Sumatriptan, and Asparagus   Assessment / Plan:     Visit Diagnoses: No diagnosis found.  Orders: No orders of the defined types were placed in this encounter.  No orders of the defined types were placed in this encounter.   Face-to-face time spent with  patient was *** minutes. Greater than 50% of time was spent in counseling and coordination of care.  Follow-Up Instructions: No follow-ups on file.   Pollyann Savoy, MD  Note - This record has been created using Animal nutritionist.  Chart creation errors have been sought, but may not always  have been located. Such creation errors do not reflect on  the standard of medical care.

## 2018-11-22 NOTE — Progress Notes (Signed)
Consent Form Botulism Toxin Injection For Chronic Migraine  Interval history 11/23/2018: extremely well > 75% decrease migraine frequency. +a   Meds ordered this encounter  Medications  . Ubrogepant (UBRELVY) 100 MG TABS    Sig: Take 100 mg by mouth every 2 (two) hours as needed. Maximum 200mg  a day.    Dispense:  10 tablet    Refill:  0    Patient has copay card; she can have medication  regardless of insurance approval or copay amount.  . Rimegepant Sulfate (NURTEC) 75 MG TBDP    Sig: Take 75 mg by mouth daily as needed. For migraines. Take as close to onset of migraine as possible. One daily maximum.    Dispense:  10 tablet    Refill:  6   Consent Form Botulism Toxin Injection For Chronic Migraine    Reviewed orally with patient, additionally signature is on file:  Botulism toxin has been approved by the Federal drug administration for treatment of chronic migraine. Botulism toxin does not cure chronic migraine and it may not be effective in some patients.  The administration of botulism toxin is accomplished by injecting a small amount of toxin into the muscles of the neck and head. Dosage must be titrated for each individual. Any benefits resulting from botulism toxin tend to wear off after 3 months with a repeat injection required if benefit is to be maintained. Injections are usually done every 3-4 months with maximum effect peak achieved by about 2 or 3 weeks. Botulism toxin is expensive and you should be sure of what costs you will incur resulting from the injection.  The side effects of botulism toxin use for chronic migraine may include:   -Transient, and usually mild, facial weakness with facial injections  -Transient, and usually mild, head or neck weakness with head/neck injections  -Reduction or loss of forehead facial animation due to forehead muscle weakness  -Eyelid drooping  -Dry eye  -Pain at the site of injection or bruising at the site of injection  -Double vision  -Potential unknown long term risks  Contraindications: You should not have Botox if you are pregnant, nursing, allergic to albumin, have an infection, skin condition, or muscle weakness at the site of the injection, or have myasthenia gravis, Lambert-Eaton syndrome, or ALS.  It is also possible that as with any injection, there may be an allergic reaction or no effect from the medication. Reduced effectiveness after repeated injections is sometimes seen and rarely infection at the injection site may occur. All care will be taken to prevent these side effects. If therapy is given over a long time, atrophy and wasting in the muscle injected may occur. Occasionally the patient's become refractory to treatment because they develop antibodies to the toxin. In this event, therapy needs to be modified.  I have read the above information and consent to the administration of botulism toxin.    BOTOX PROCEDURE NOTE FOR MIGRAINE HEADACHE    Contraindications and precautions discussed with patient(above). Aseptic procedure was observed and patient tolerated procedure. Procedure performed by Dr.  The condition has existed for more than 6 months, and pt does not have a diagnosis of ALS, Myasthenia Gravis or Lambert-Eaton Syndrome.  Risks and benefits of injections discussed and pt agrees to proceed with the procedure.  Written consent obtained  These injections are medically necessary. Pt  receives good benefits from these injections. These injections do not cause sedations or hallucinations which the oral therapies may cause.  Description of procedure:  The patient was placed in a sitting position. The standard protocol was used for Botox as follows, with 5 units of Botox injected at each site:   -Procerus muscle, midline injection  -Corrugator muscle, bilateral injection  -Frontalis muscle, bilateral injection, with 2 sites each side, medial injection was performed in the  upper one third of the frontalis muscle, in the region vertical from the medial inferior edge of the superior orbital rim. The lateral injection was again in the upper one third of the forehead vertically above the lateral limbus of the cornea, 1.5 cm lateral to the medial injection site.  -Temporalis muscle injection, 4 sites, bilaterally. The first injection was 3 cm above the tragus of the ear, second injection site was 1.5 cm to 3 cm up from the first injection site in line with the tragus of the ear. The third injection site was 1.5-3 cm forward between the first 2 injection sites. The fourth injection site was 1.5 cm posterior to the second injection site.   -Occipitalis muscle injection, 3 sites, bilaterally. The first injection was done one half way between the occipital protuberance and the tip of the mastoid process behind the ear. The second injection site was done lateral and superior to the first, 1 fingerbreadth from the first injection. The third injection site was 1 fingerbreadth superiorly and medially from the first injection site.  -Cervical paraspinal muscle injection, 2 sites, bilateral knee first injection site was 1 cm from the midline of the cervical spine, 3 cm inferior to the lower border of the occipital protuberance. The second injection site was 1.5 cm superiorly and laterally to the first injection site.  -Trapezius muscle injection was performed at 3 sites, bilaterally. The first injection site was in the upper trapezius muscle halfway between the inflection point of the neck, and the acromion. The second injection site was one half way between the acromion and the first injection site. The third injection was done between the first injection site and the inflection point of the neck.   Will return for repeat injection in 3 months.   200 units of Botox was used, any Botox not injected was wasted. The patient tolerated the procedure well, there were no complications of the  above procedure.

## 2018-11-23 ENCOUNTER — Ambulatory Visit: Payer: BC Managed Care – PPO | Admitting: Neurology

## 2018-11-23 ENCOUNTER — Other Ambulatory Visit: Payer: Self-pay

## 2018-11-23 ENCOUNTER — Encounter: Payer: Self-pay | Admitting: Neurology

## 2018-11-23 VITALS — Temp 97.8°F

## 2018-11-23 DIAGNOSIS — G43709 Chronic migraine without aura, not intractable, without status migrainosus: Secondary | ICD-10-CM

## 2018-11-23 MED ORDER — NURTEC 75 MG PO TBDP: 75.0000 mg | ORAL_TABLET | Freq: Every day | ORAL | 6 refills | Status: DC | PRN

## 2018-11-23 MED ORDER — UBRELVY 100 MG PO TABS: 100.0000 mg | ORAL_TABLET | ORAL | 0 refills | Status: DC | PRN

## 2018-11-23 NOTE — Patient Instructions (Addendum)
[employer]: Patient drug information Access [employer] Online here. Copyright [phone] [employer] All rights reserved. (For additional information see "[employer]: Drug information") Brand Names: [place]  Nurtec  What is this drug used for?   It is used to treat migraine headaches.  What do I need to tell my doctor BEFORE I take this drug?   If you are allergic to this drug; any part of this drug; or any other drugs, foods, or substances. Tell your doctor about the allergy and what signs you had.   If you have any of these health problems: Kidney disease or liver disease.   If you take any drugs (prescription or OTC, natural products, vitamins) that must not be taken with this drug, like certain drugs that are used for HIV, infections, or seizures. There are many drugs that must not be taken with this drug.   This is not a list of all drugs or health problems that interact with this drug.   Tell your doctor and pharmacist about all of your drugs (prescription or OTC, natural products, vitamins) and health problems. You must check to make sure that it is safe for you to take this drug with all of your drugs and health problems. Do not start, stop, or change the dose of any drug without checking with your doctor.  What are some things I need to know or do while I take this drug?   Tell all of your health care providers that you take this drug. This includes your doctors, nurses, pharmacists, and dentists.   This drug is not meant to prevent or lower the number of migraine headaches you get.   Tell your doctor if you are pregnant, plan on getting pregnant, or are breast-feeding. You will need to talk about the benefits and risks to you and the baby.  What are some side effects that I need to call my doctor about right away?   WARNING/CAUTION: Even though it may be rare, some people may have very bad and sometimes deadly side effects when taking a drug. Tell your doctor or get medical help right  away if you have any of the following signs or symptoms that may be related to a very bad side effect:   Signs of an allergic reaction, like rash; hives; itching; red, swollen, blistered, or peeling skin with or without fever; wheezing; tightness in the chest or throat; trouble breathing, swallowing, or talking; unusual hoarseness; or swelling of the mouth, face, lips, tongue, or throat.  What are some other side effects of this drug?   All drugs may cause side effects. However, many people have no side effects or only have minor side effects. Call your doctor or get medical help if any of these side effects or any other side effects bother you or do not go away:   Upset stomach.   These are not all of the side effects that may occur. If you have questions about side effects, call your doctor. Call your doctor for medical advice about side effects.   You may report side effects to your national health agency.  How is this drug best taken?   Use this drug as ordered by your doctor. Read all information given to you. Follow all instructions closely.   Do not push the tablet out of the foil when opening. Use dry hands to take it from the foil. Place on your tongue and let it dissolve. Water is not needed. Do not swallow it whole. Do  not chew, break, or crush it.   If needed, you may place the tablet under the tongue.   Use right after opening.  What do I do if I miss a dose?   This drug is taken on an as needed basis. Do not take more often than told by the doctor.  How do I store and/or throw out this drug?   Store at room temperature in a dry place. Do not store in a bathroom.   Store in foil pouch until ready for use.   Keep all drugs in a safe place. Keep all drugs out of the reach of children and pets.   Throw away unused or expired drugs. Do not flush down a toilet or pour down a drain unless you are told to do so. Check with your pharmacist if you have questions about the best way to throw  out drugs. There may be drug take-back programs in your area.  General drug facts   If your symptoms or health problems do not get better or if they become worse, call your doctor.   Do not share your drugs with others and do not take anyone else's drugs.   Some drugs may have another patient information leaflet. If you have any questions about this drug, please talk with your doctor, nurse, pharmacist, or other health care provider.   If you think there has been an overdose, call your poison control center or get medical care right away. Be ready to tell or show what was taken, how much, and when it happened.  [employer]: Patient drug information Access [employer] here. Copyright [phone] [employer] All rights reserved. (For additional information see "[employer]: Drug information") Brand Names: [place]  Bernita Raisin  What is this drug used for?   It is used to treat migraine headaches.  What do I need to tell my doctor BEFORE I take this drug?   If you are allergic to this drug; any part of this drug; or any other drugs, foods, or substances. Tell your doctor about the allergy and what signs you had.   If you have kidney disease.   If you take any drugs (prescription or OTC, natural products, vitamins) that must not be taken with this drug, like certain drugs that are used for HIV, infections, or seizures. There are many drugs that must not be taken with this drug.   This is not a list of all drugs or health problems that interact with this drug.   Tell your doctor and pharmacist about all of your drugs (prescription or OTC, natural products, vitamins) and health problems. You must check to make sure that it is safe for you to take this drug with all of your drugs and health problems. Do not start, stop, or change the dose of any drug without checking with your doctor.  What are some things I need to know or do while I take this drug?   Tell all of your health care providers that you  take this drug. This includes your doctors, nurses, pharmacists, and dentists.   If you drink grapefruit juice or eat grapefruit often, talk with your doctor.   Tell your doctor if you are pregnant, plan on getting pregnant, or are breast-feeding. You will need to talk about the benefits and risks to you and the baby.  What are some side effects that I need to call my doctor about right away?   WARNING/CAUTION: Even though it may be rare, some  people may have very bad and sometimes deadly side effects when taking a drug. Tell your doctor or get medical help right away if you have any of the following signs or symptoms that may be related to a very bad side effect:   Signs of an allergic reaction, like rash; hives; itching; red, swollen, blistered, or peeling skin with or without fever; wheezing; tightness in the chest or throat; trouble breathing, swallowing, or talking; unusual hoarseness; or swelling of the mouth, face, lips, tongue, or throat.  What are some other side effects of this drug?   All drugs may cause side effects. However, many people have no side effects or only have minor side effects. Call your doctor or get medical help if any of these side effects or any other side effects bother you or do not go away:   Upset stomach.   Feeling sleepy.   These are not all of the side effects that may occur. If you have questions about side effects, call your doctor. Call your doctor for medical advice about side effects.   You may report side effects to your national health agency.  How is this drug best taken?   Use this drug as ordered by your doctor. Read all information given to you. Follow all instructions closely.   Take with or without food.   Take as early as you can after the attack has started.   If needed, another dose may be taken as your doctor has told you. Be sure you know how many hours to wait before taking another dose.  What do I do if I miss a dose?   This drug is taken  on an as needed basis. Do not take more often than told by the doctor.  How do I store and/or throw out this drug?   Store at room temperature in a dry place. Do not store in a bathroom.   Keep all drugs in a safe place. Keep all drugs out of the reach of children and pets.   Throw away unused or expired drugs. Do not flush down a toilet or pour down a drain unless you are told to do so. Check with your pharmacist if you have questions about the best way to throw out drugs. There may be drug take-back programs in your area.  General drug facts   If your symptoms or health problems do not get better or if they become worse, call your doctor.   Do not share your drugs with others and do not take anyone else's drugs.   Some drugs may have another patient information leaflet. If you have any questions about this drug, please talk with your doctor, nurse, pharmacist, or other health care provider.   If you think there has been an overdose, call your poison control center or get medical care right away. Be ready to tell or show what was taken, how much, and when it happened.  [employer]: Patient drug information Access [employer] Online here. Copyright [phone] [employer] All rights reserved. (For additional information see "[employer]: Drug information") Brand Names: [place]  Bernita Raisin  What is this drug used for?   It is used to treat migraine headaches.  What do I need to tell my doctor BEFORE I take this drug?   If you are allergic to this drug; any part of this drug; or any other drugs, foods, or substances. Tell your doctor about the allergy and what signs you had.   If you  have kidney disease.   If you take any drugs (prescription or OTC, natural products, vitamins) that must not be taken with this drug, like certain drugs that are used for HIV, infections, or seizures. There are many drugs that must not be taken with this drug.   This is not a list of all drugs or health problems that  interact with this drug.   Tell your doctor and pharmacist about all of your drugs (prescription or OTC, natural products, vitamins) and health problems. You must check to make sure that it is safe for you to take this drug with all of your drugs and health problems. Do not start, stop, or change the dose of any drug without checking with your doctor.  What are some things I need to know or do while I take this drug?   Tell all of your health care providers that you take this drug. This includes your doctors, nurses, pharmacists, and dentists.   If you drink grapefruit juice or eat grapefruit often, talk with your doctor.   Tell your doctor if you are pregnant, plan on getting pregnant, or are breast-feeding. You will need to talk about the benefits and risks to you and the baby.  What are some side effects that I need to call my doctor about right away?   WARNING/CAUTION: Even though it may be rare, some people may have very bad and sometimes deadly side effects when taking a drug. Tell your doctor or get medical help right away if you have any of the following signs or symptoms that may be related to a very bad side effect:   Signs of an allergic reaction, like rash; hives; itching; red, swollen, blistered, or peeling skin with or without fever; wheezing; tightness in the chest or throat; trouble breathing, swallowing, or talking; unusual hoarseness; or swelling of the mouth, face, lips, tongue, or throat.  What are some other side effects of this drug?   All drugs may cause side effects. However, many people have no side effects or only have minor side effects. Call your doctor or get medical help if any of these side effects or any other side effects bother you or do not go away:   Upset stomach.   Feeling sleepy.   These are not all of the side effects that may occur. If you have questions about side effects, call your doctor. Call your doctor for medical advice about side effects.   You may  report side effects to your national health agency.  How is this drug best taken?   Use this drug as ordered by your doctor. Read all information given to you. Follow all instructions closely.   Take with or without food.   Take as early as you can after the attack has started.   If needed, another dose may be taken as your doctor has told you. Be sure you know how many hours to wait before taking another dose.  What do I do if I miss a dose?   This drug is taken on an as needed basis. Do not take more often than told by the doctor.  How do I store and/or throw out this drug?   Store at room temperature in a dry place. Do not store in a bathroom.   Keep all drugs in a safe place. Keep all drugs out of the reach of children and pets.   Throw away unused or expired drugs. Do not flush down a toilet  or pour down a drain unless you are told to do so. Check with your pharmacist if you have questions about the best way to throw out drugs. There may be drug take-back programs in your area.  General drug facts   If your symptoms or health problems do not get better or if they become worse, call your doctor.   Do not share your drugs with others and do not take anyone else's drugs.   Some drugs may have another patient information leaflet. If you have any questions about this drug, please talk with your doctor, nurse, pharmacist, or other health care provider.   If you think there has been an overdose, call your poison control center or get medical care right away. Be ready to tell or show what was taken, how much, and when it happened.  [employer]: Patient drug information Access [employer] here. Copyright [phone] [employer] All rights reserved. (For additional information see "[employer]: Drug information") Brand Names: [place]  Bernita Raisin  What is this drug used for?   It is used to treat migraine headaches.  What do I need to tell my doctor BEFORE I take this drug?   If you are  allergic to this drug; any part of this drug; or any other drugs, foods, or substances. Tell your doctor about the allergy and what signs you had.   If you have kidney disease.   If you take any drugs (prescription or OTC, natural products, vitamins) that must not be taken with this drug, like certain drugs that are used for HIV, infections, or seizures. There are many drugs that must not be taken with this drug.   This is not a list of all drugs or health problems that interact with this drug.   Tell your doctor and pharmacist about all of your drugs (prescription or OTC, natural products, vitamins) and health problems. You must check to make sure that it is safe for you to take this drug with all of your drugs and health problems. Do not start, stop, or change the dose of any drug without checking with your doctor.  What are some things I need to know or do while I take this drug?   Tell all of your health care providers that you take this drug. This includes your doctors, nurses, pharmacists, and dentists.   If you drink grapefruit juice or eat grapefruit often, talk with your doctor.   Tell your doctor if you are pregnant, plan on getting pregnant, or are breast-feeding. You will need to talk about the benefits and risks to you and the baby.  What are some side effects that I need to call my doctor about right away?   WARNING/CAUTION: Even though it may be rare, some people may have very bad and sometimes deadly side effects when taking a drug. Tell your doctor or get medical help right away if you have any of the following signs or symptoms that may be related to a very bad side effect:   Signs of an allergic reaction, like rash; hives; itching; red, swollen, blistered, or peeling skin with or without fever; wheezing; tightness in the chest or throat; trouble breathing, swallowing, or talking; unusual hoarseness; or swelling of the mouth, face, lips, tongue, or throat.  What are some other side  effects of this drug?   All drugs may cause side effects. However, many people have no side effects or only have minor side effects. Call your doctor or get medical  help if any of these side effects or any other side effects bother you or do not go away:   Upset stomach.   Feeling sleepy.   These are not all of the side effects that may occur. If you have questions about side effects, call your doctor. Call your doctor for medical advice about side effects.   You may report side effects to your national health agency.  How is this drug best taken?   Use this drug as ordered by your doctor. Read all information given to you. Follow all instructions closely.   Take with or without food.   Take as early as you can after the attack has started.   If needed, another dose may be taken as your doctor has told you. Be sure you know how many hours to wait before taking another dose.  What do I do if I miss a dose?   This drug is taken on an as needed basis. Do not take more often than told by the doctor.  How do I store and/or throw out this drug?   Store at room temperature in a dry place. Do not store in a bathroom.   Keep all drugs in a safe place. Keep all drugs out of the reach of children and pets.   Throw away unused or expired drugs. Do not flush down a toilet or pour down a drain unless you are told to do so. Check with your pharmacist if you have questions about the best way to throw out drugs. There may be drug take-back programs in your area.  General drug facts   If your symptoms or health problems do not get better or if they become worse, call your doctor.   Do not share your drugs with others and do not take anyone else's drugs.   Some drugs may have another patient information leaflet. If you have any questions about this drug, please talk with your doctor, nurse, pharmacist, or other health care provider.   If you think there has been an overdose, call your poison control center or  get medical care right away. Be ready to tell or show what was taken, how much, and when it happened.

## 2018-11-23 NOTE — Progress Notes (Signed)
Botox- 200 units x 1 vial Lot: [id number] Expiration: 08/2021 NDC: 1601-0932-35  Bacteriostatic 0.9% Sodium Chloride- 71mL total Lot: [id number] Expiration: 07/07/2019 NDC: 2542-7062-37  Dx: S28.315 S/P

## 2018-12-01 ENCOUNTER — Ambulatory Visit: Payer: Self-pay | Admitting: Rheumatology

## 2018-12-06 ENCOUNTER — Other Ambulatory Visit: Payer: Self-pay | Admitting: Neurology

## 2018-12-08 ENCOUNTER — Telehealth: Payer: Self-pay | Admitting: Neurology

## 2018-12-08 NOTE — Telephone Encounter (Signed)
Patient called wanting to know  Know if she could be prescribed something for headaches. Patient states its been about 4-5 days that once her medication wears off the headaches come back.   Please follow up.

## 2018-12-09 ENCOUNTER — Other Ambulatory Visit: Payer: Self-pay | Admitting: *Deleted

## 2018-12-09 ENCOUNTER — Encounter: Payer: Self-pay | Admitting: *Deleted

## 2018-12-09 MED ORDER — METHYLPREDNISOLONE 4 MG PO TBPK: ORAL_TABLET | ORAL | 0 refills | Status: DC

## 2018-12-09 NOTE — Telephone Encounter (Signed)
Totally fine

## 2018-12-09 NOTE — Telephone Encounter (Signed)
Pt returned call. Stated she has had more h/a than usual. She has had one on and off since Sunday. Medication usually helps. She would like a steroid dose pack and said Dr. Jaynee Eagles has prescribed for her in the past to break the cycle. I advised pt I would d/w Dr. Jaynee Eagles and then we can send in if she agrees. Pt understands steroids to be taken with food and would be a tapering dose over about a week. She denies any known allergies to prednisone, methylprednisolone, or other steroids. She also denied any concerning or new symptoms with this recurring headache. She verbalized appreciation.   Pharmacy: [facility] on [address], [place].

## 2018-12-09 NOTE — Telephone Encounter (Signed)
Order placed for medrol dose pack. mychart message sent to pt.

## 2018-12-09 NOTE — Telephone Encounter (Signed)
I called the pt and LVM asking for call back. Left office number in message.  

## 2018-12-09 NOTE — Progress Notes (Signed)
Medrol dose pack per Dr. Jaynee Eagles.

## 2018-12-16 NOTE — Telephone Encounter (Signed)
Error. DW  °

## 2019-02-24 ENCOUNTER — Telehealth: Payer: Self-pay

## 2019-02-28 ENCOUNTER — Telehealth: Payer: Self-pay | Admitting: Neurology

## 2019-02-28 NOTE — Telephone Encounter (Signed)
Pt has called to report that she has spoken with both her pharmacy and her insurance company.  The insurance company filed incorrectly to insurance and they are trying to correct this matter so medication can be shipped in time of appointment.  Pt states she will check with insurance and pharmacy later today and update Danielle later today.  No call back requested at this time

## 2019-03-01 ENCOUNTER — Ambulatory Visit: Payer: BC Managed Care – PPO | Admitting: Neurology

## 2019-03-10 NOTE — Telephone Encounter (Signed)
Spoke with pt & scheduled her for Botox next Monday 3/8 @ 3:30 pm. Ok per Dr. Lucia Gaskins.

## 2019-03-14 ENCOUNTER — Other Ambulatory Visit: Payer: Self-pay

## 2019-03-14 ENCOUNTER — Ambulatory Visit: Payer: BC Managed Care – PPO | Admitting: Neurology

## 2019-03-14 VITALS — Temp 98.0°F

## 2019-03-14 DIAGNOSIS — G43709 Chronic migraine without aura, not intractable, without status migrainosus: Secondary | ICD-10-CM | POA: Diagnosis not present

## 2019-03-14 NOTE — Progress Notes (Signed)
Consent Form Botulism Toxin Injection For Chronic Migraine  Interval history 03/14/2019: extremely well > 75% decrease migraine frequency. +a   No orders of the defined types were placed in this encounter.  Consent Form Botulism Toxin Injection For Chronic Migraine    Reviewed orally with patient, additionally signature is on file:  Botulism toxin has been approved by the Federal drug administration for treatment of chronic migraine. Botulism toxin does not cure chronic migraine and it may not be effective in some patients.  The administration of botulism toxin is accomplished by injecting a small amount of toxin into the muscles of the neck and head. Dosage must be titrated for each individual. Any benefits resulting from botulism toxin tend to wear off after 3 months with a repeat injection required if benefit is to be maintained. Injections are usually done every 3-4 months with maximum effect peak achieved by about 2 or 3 weeks. Botulism toxin is expensive and you should be sure of what costs you will incur resulting from the injection.  The side effects of botulism toxin use for chronic migraine may include:   -Transient, and usually mild, facial weakness with facial injections  -Transient, and usually mild, head or neck weakness with head/neck injections  -Reduction or loss of forehead facial animation due to forehead muscle weakness  -Eyelid drooping  -Dry eye  -Pain at the site of injection or bruising at the site of injection  -Double vision  -Potential unknown long term risks  Contraindications: You should not have Botox if you are pregnant, nursing, allergic to albumin, have an infection, skin condition, or muscle weakness at the site of the injection, or have myasthenia gravis, Lambert-Eaton syndrome, or ALS.  It is also possible that as with any injection, there may be an allergic reaction or no effect from the medication. Reduced effectiveness after repeated injections  is sometimes seen and rarely infection at the injection site may occur. All care will be taken to prevent these side effects. If therapy is given over a long time, atrophy and wasting in the muscle injected may occur. Occasionally the patient's become refractory to treatment because they develop antibodies to the toxin. In this event, therapy needs to be modified.  I have read the above information and consent to the administration of botulism toxin.    BOTOX PROCEDURE NOTE FOR MIGRAINE HEADACHE    Contraindications and precautions discussed with patient(above). Aseptic procedure was observed and patient tolerated procedure. Procedure performed by Dr. Artemio Aly  The condition has existed for more than 6 months, and pt does not have a diagnosis of ALS, Myasthenia Gravis or Lambert-Eaton Syndrome.  Risks and benefits of injections discussed and pt agrees to proceed with the procedure.  Written consent obtained  These injections are medically necessary. Pt  receives good benefits from these injections. These injections do not cause sedations or hallucinations which the oral therapies may cause.  Description of procedure:  The patient was placed in a sitting position. The standard protocol was used for Botox as follows, with 5 units of Botox injected at each site:   -Procerus muscle, midline injection  -Corrugator muscle, bilateral injection  -Frontalis muscle, bilateral injection, with 2 sites each side, medial injection was performed in the upper one third of the frontalis muscle, in the region vertical from the medial inferior edge of the superior orbital rim. The lateral injection was again in the upper one third of the forehead vertically above the lateral limbus of the cornea,  1.5 cm lateral to the medial injection site.  -Temporalis muscle injection, 4 sites, bilaterally. The first injection was 3 cm above the tragus of the ear, second injection site was 1.5 cm to 3 cm up from the  first injection site in line with the tragus of the ear. The third injection site was 1.5-3 cm forward between the first 2 injection sites. The fourth injection site was 1.5 cm posterior to the second injection site.   -Occipitalis muscle injection, 3 sites, bilaterally. The first injection was done one half way between the occipital protuberance and the tip of the mastoid process behind the ear. The second injection site was done lateral and superior to the first, 1 fingerbreadth from the first injection. The third injection site was 1 fingerbreadth superiorly and medially from the first injection site.  -Cervical paraspinal muscle injection, 2 sites, bilateral knee first injection site was 1 cm from the midline of the cervical spine, 3 cm inferior to the lower border of the occipital protuberance. The second injection site was 1.5 cm superiorly and laterally to the first injection site.  -Trapezius muscle injection was performed at 3 sites, bilaterally. The first injection site was in the upper trapezius muscle halfway between the inflection point of the neck, and the acromion. The second injection site was one half way between the acromion and the first injection site. The third injection was done between the first injection site and the inflection point of the neck.   Will return for repeat injection in 3 months.   200 units of Botox was used, any Botox not injected was wasted. The patient tolerated the procedure well, there were no complications of the above procedure.

## 2019-03-14 NOTE — Progress Notes (Signed)
Botox- 100 units x 2 vials Lot: [id number] Expiration: 08/2021 NDC: 2376-2831-51  Bacteriostatic 0.9% Sodium Chloride- 63mL total Lot: [id number] Expiration: 04/07/2019 NDC: 3710-6269-48  Dx: N46.270 S/P

## 2019-03-14 NOTE — Telephone Encounter (Signed)
Noted, thank you. DWD  

## 2019-06-08 ENCOUNTER — Telehealth: Payer: Self-pay | Admitting: Neurology

## 2019-06-08 NOTE — Telephone Encounter (Signed)
Patient has Botox appointment on 6/29. I received approval from [facility] via fax. PA #[id number]. Valid from 06/07/2019 to 06/05/2020. Can you send a prescription that matches the authorization to [facility] ([place], [place])?

## 2019-06-09 MED ORDER — BOTOX 100 UNITS IJ SOLR: INTRAMUSCULAR | 3 refills | Status: DC

## 2019-06-09 NOTE — Telephone Encounter (Signed)
Botox 100 unit order sent to [facility]

## 2019-06-09 NOTE — Telephone Encounter (Signed)
I called [facility] [phone]) and spoke with Lindsey Fitzgerald. I explained to her that there has been confusion about the units of Botox needed, and asked her if I needed to get a new PA. She explained to me that [employer] has a new process this year, the insurance will cover whatever strength is used at the approved 4 visits during the effective dates of 06/07/19 to 06/05/20. It is the office visits that they are covering so it does not matter which strength is listed on the PA. She stated that [employer] has received a lot of pushback from the specialty pharmacies over this and the pharmacies call and ask for clarification because they see 100U on the PA but the prescription does not match, which makes the process longer. She also told me that [facility] is the preferred pharmacy for [employer] this year.

## 2019-06-09 NOTE — Addendum Note (Signed)
Addended by: Bertram Savin on: 06/09/2019 09:04 AM   Modules accepted: Orders

## 2019-06-13 ENCOUNTER — Ambulatory Visit: Payer: BC Managed Care – PPO | Admitting: Neurology

## 2019-06-14 NOTE — Telephone Encounter (Signed)
Medication delivery scheduled for 06/22/19 from [facility].

## 2019-06-22 NOTE — Telephone Encounter (Signed)
Medication delivered 06/22/19 for 07/05/19 appointment. (2) 100U vials of Botox. Placed them in fridge with patient's name written on the boxes.

## 2019-07-05 ENCOUNTER — Ambulatory Visit: Payer: BC Managed Care – PPO | Admitting: Neurology

## 2019-07-05 ENCOUNTER — Other Ambulatory Visit: Payer: Self-pay

## 2019-07-05 DIAGNOSIS — G43709 Chronic migraine without aura, not intractable, without status migrainosus: Secondary | ICD-10-CM | POA: Diagnosis not present

## 2019-07-05 MED ORDER — NURTEC 75 MG PO TBDP: 75.0000 mg | ORAL_TABLET | ORAL | 5 refills | Status: DC

## 2019-07-05 NOTE — Progress Notes (Signed)
Consent Form Botulism Toxin Injection For Chronic Migraine  Interval history 07/05/2019: extremely well > 75% decrease migraine frequency. +a. She likes nurtec will order from specialty pharmacy  No orders of the defined types were placed in this encounter.  Consent Form Botulism Toxin Injection For Chronic Migraine    Reviewed orally with patient, additionally signature is on file:  Botulism toxin has been approved by the Federal drug administration for treatment of chronic migraine. Botulism toxin does not cure chronic migraine and it may not be effective in some patients.  The administration of botulism toxin is accomplished by injecting a small amount of toxin into the muscles of the neck and head. Dosage must be titrated for each individual. Any benefits resulting from botulism toxin tend to wear off after 3 months with a repeat injection required if benefit is to be maintained. Injections are usually done every 3-4 months with maximum effect peak achieved by about 2 or 3 weeks. Botulism toxin is expensive and you should be sure of what costs you will incur resulting from the injection.  The side effects of botulism toxin use for chronic migraine may include:   -Transient, and usually mild, facial weakness with facial injections  -Transient, and usually mild, head or neck weakness with head/neck injections  -Reduction or loss of forehead facial animation due to forehead muscle weakness  -Eyelid drooping  -Dry eye  -Pain at the site of injection or bruising at the site of injection  -Double vision  -Potential unknown long term risks  Contraindications: You should not have Botox if you are pregnant, nursing, allergic to albumin, have an infection, skin condition, or muscle weakness at the site of the injection, or have myasthenia gravis, Lambert-Eaton syndrome, or ALS.  It is also possible that as with any injection, there may be an allergic reaction or no effect from the  medication. Reduced effectiveness after repeated injections is sometimes seen and rarely infection at the injection site may occur. All care will be taken to prevent these side effects. If therapy is given over a long time, atrophy and wasting in the muscle injected may occur. Occasionally the patient's become refractory to treatment because they develop antibodies to the toxin. In this event, therapy needs to be modified.  I have read the above information and consent to the administration of botulism toxin.    BOTOX PROCEDURE NOTE FOR MIGRAINE HEADACHE    Contraindications and precautions discussed with patient(above). Aseptic procedure was observed and patient tolerated procedure. Procedure performed by Dr. Artemio Aly  The condition has existed for more than 6 months, and pt does not have a diagnosis of ALS, Myasthenia Gravis or Lambert-Eaton Syndrome.  Risks and benefits of injections discussed and pt agrees to proceed with the procedure.  Written consent obtained  These injections are medically necessary. Pt  receives good benefits from these injections. These injections do not cause sedations or hallucinations which the oral therapies may cause.  Description of procedure:  The patient was placed in a sitting position. The standard protocol was used for Botox as follows, with 5 units of Botox injected at each site:   -Procerus muscle, midline injection  -Corrugator muscle, bilateral injection  -Frontalis muscle, bilateral injection, with 2 sites each side, medial injection was performed in the upper one third of the frontalis muscle, in the region vertical from the medial inferior edge of the superior orbital rim. The lateral injection was again in the upper one third of the forehead vertically  above the lateral limbus of the cornea, 1.5 cm lateral to the medial injection site.  -Temporalis muscle injection, 4 sites, bilaterally. The first injection was 3 cm above the tragus of the  ear, second injection site was 1.5 cm to 3 cm up from the first injection site in line with the tragus of the ear. The third injection site was 1.5-3 cm forward between the first 2 injection sites. The fourth injection site was 1.5 cm posterior to the second injection site.   -Occipitalis muscle injection, 3 sites, bilaterally. The first injection was done one half way between the occipital protuberance and the tip of the mastoid process behind the ear. The second injection site was done lateral and superior to the first, 1 fingerbreadth from the first injection. The third injection site was 1 fingerbreadth superiorly and medially from the first injection site.  -Cervical paraspinal muscle injection, 2 sites, bilateral knee first injection site was 1 cm from the midline of the cervical spine, 3 cm inferior to the lower border of the occipital protuberance. The second injection site was 1.5 cm superiorly and laterally to the first injection site.  -Trapezius muscle injection was performed at 3 sites, bilaterally. The first injection site was in the upper trapezius muscle halfway between the inflection point of the neck, and the acromion. The second injection site was one half way between the acromion and the first injection site. The third injection was done between the first injection site and the inflection point of the neck.   Will return for repeat injection in 3 months.   200 units of Botox was used, any Botox not injected was wasted. The patient tolerated the procedure well, there were no complications of the above procedure.

## 2019-07-05 NOTE — Progress Notes (Signed)
Botox- 100 units x 2 vials Lot: [id number] Expiration: 10/2022 NDC: 3428-7681-15  Bacteriostatic 0.9% Sodium Chloride- 74mL total Lot: [id number] Expiration: 07/07/2019 NDC: 5597-4163-84  Dx: T36.468 S/P

## 2019-08-03 ENCOUNTER — Telehealth: Payer: Self-pay

## 2019-08-03 NOTE — Telephone Encounter (Signed)
Received PA request for Nurtec ODT 75mg . I have completed paper form and gave it to [place], RN to have Dr. [place] sign at her convenience.

## 2019-08-04 NOTE — Telephone Encounter (Signed)
Faxed signed PA request to [facility], [phone].

## 2019-08-16 NOTE — Telephone Encounter (Signed)
I would call and discuss with patient and see if the nurtec is even helpful for her prior to starting any work. If it is then let's try to appeal.

## 2019-08-16 NOTE — Telephone Encounter (Signed)
Received fax from [facility] stating that the Nurtec had been denied. Reasons for denial are: She will need to try and fail 2 triptan medications and Ubrelvy. I reviewed her chart and it appears that she does not want to take any triptans to due to a family history of adverse reaction (Dad went into cardiac arrest from a triptan medication). She was recently on Ubrelvy but was switched to Nurtec in June 2021.   We can attempt an appeal but [employer] will require a signed release from the patient stating that she gives [place] permission to appeal on her behalf.   Dr. Lucia Gaskins, would you like for [place] to appeal this denial? Thanks!

## 2019-09-27 ENCOUNTER — Telehealth: Payer: Self-pay | Admitting: Neurology

## 2019-09-27 NOTE — Telephone Encounter (Signed)
Patient has a Botox appointment on 10/5. I called [facility] and spoke with [place] to see if Botox delivery could be scheduled. Botox TBD 9/28.

## 2019-10-04 NOTE — Telephone Encounter (Signed)
(  2) 100U vials of Botox delivered today from [facility]. 

## 2019-10-11 ENCOUNTER — Other Ambulatory Visit: Payer: Self-pay

## 2019-10-11 ENCOUNTER — Ambulatory Visit: Payer: BC Managed Care – PPO | Admitting: Neurology

## 2019-10-11 DIAGNOSIS — G43709 Chronic migraine without aura, not intractable, without status migrainosus: Secondary | ICD-10-CM | POA: Diagnosis not present

## 2019-10-11 MED ORDER — NURTEC 75 MG PO TBDP: 75.0000 mg | ORAL_TABLET | Freq: Every day | ORAL | 0 refills | Status: DC | PRN

## 2019-10-11 MED ORDER — CAMBIA 50 MG PO PACK
PACK | ORAL | 3 refills | Status: DC
Start: 2019-10-11 — End: 2023-01-20

## 2019-10-11 NOTE — Progress Notes (Signed)
Botox- 100 units x 2 vials Lot: [id number] Expiration: 01/2022 NDC: 8546-2703-50  Bacteriostatic 0.9% Sodium Chloride- 17mL total Lot: [id number] Expiration: 10/06/2020 NDC: 2993-7169-67  Dx: E93.810 S/P

## 2019-10-11 NOTE — Progress Notes (Signed)
Consent Form Botulism Toxin Injection For Chronic Migraine  Interval history 10/11/2019: extremely well > 75% decrease migraine frequency. +a. She likes nurtec will order from specialty pharmacy  Meds ordered this encounter  Medications  . Rimegepant Sulfate (NURTEC) 75 MG TBDP    Sig: Take 75 mg by mouth daily as needed. For migraines. Take as close to onset of migraine as possible. One daily maximum.    Dispense:  16 tablet    Refill:  0   Consent Form Botulism Toxin Injection For Chronic Migraine    Reviewed orally with patient, additionally signature is on file:  Botulism toxin has been approved by the Federal drug administration for treatment of chronic migraine. Botulism toxin does not cure chronic migraine and it may not be effective in some patients.  The administration of botulism toxin is accomplished by injecting a small amount of toxin into the muscles of the neck and head. Dosage must be titrated for each individual. Any benefits resulting from botulism toxin tend to wear off after 3 months with a repeat injection required if benefit is to be maintained. Injections are usually done every 3-4 months with maximum effect peak achieved by about 2 or 3 weeks. Botulism toxin is expensive and you should be sure of what costs you will incur resulting from the injection.  The side effects of botulism toxin use for chronic migraine may include:   -Transient, and usually mild, facial weakness with facial injections  -Transient, and usually mild, head or neck weakness with head/neck injections  -Reduction or loss of forehead facial animation due to forehead muscle weakness  -Eyelid drooping  -Dry eye  -Pain at the site of injection or bruising at the site of injection  -Double vision  -Potential unknown long term risks  Contraindications: You should not have Botox if you are pregnant, nursing, allergic to albumin, have an infection, skin condition, or muscle weakness at the site  of the injection, or have myasthenia gravis, Lambert-Eaton syndrome, or ALS.  It is also possible that as with any injection, there may be an allergic reaction or no effect from the medication. Reduced effectiveness after repeated injections is sometimes seen and rarely infection at the injection site may occur. All care will be taken to prevent these side effects. If therapy is given over a long time, atrophy and wasting in the muscle injected may occur. Occasionally the patient's become refractory to treatment because they develop antibodies to the toxin. In this event, therapy needs to be modified.  I have read the above information and consent to the administration of botulism toxin.    BOTOX PROCEDURE NOTE FOR MIGRAINE HEADACHE    Contraindications and precautions discussed with patient(above). Aseptic procedure was observed and patient tolerated procedure. Procedure performed by Dr. Artemio Aly  The condition has existed for more than 6 months, and pt does not have a diagnosis of ALS, Myasthenia Gravis or Lambert-Eaton Syndrome.  Risks and benefits of injections discussed and pt agrees to proceed with the procedure.  Written consent obtained  These injections are medically necessary. Pt  receives good benefits from these injections. These injections do not cause sedations or hallucinations which the oral therapies may cause.  Description of procedure:  The patient was placed in a sitting position. The standard protocol was used for Botox as follows, with 5 units of Botox injected at each site:   -Procerus muscle, midline injection  -Corrugator muscle, bilateral injection  -Frontalis muscle, bilateral injection, with 2 sites  each side, medial injection was performed in the upper one third of the frontalis muscle, in the region vertical from the medial inferior edge of the superior orbital rim. The lateral injection was again in the upper one third of the forehead vertically above the  lateral limbus of the cornea, 1.5 cm lateral to the medial injection site.  -Temporalis muscle injection, 4 sites, bilaterally. The first injection was 3 cm above the tragus of the ear, second injection site was 1.5 cm to 3 cm up from the first injection site in line with the tragus of the ear. The third injection site was 1.5-3 cm forward between the first 2 injection sites. The fourth injection site was 1.5 cm posterior to the second injection site.   -Occipitalis muscle injection, 3 sites, bilaterally. The first injection was done one half way between the occipital protuberance and the tip of the mastoid process behind the ear. The second injection site was done lateral and superior to the first, 1 fingerbreadth from the first injection. The third injection site was 1 fingerbreadth superiorly and medially from the first injection site.  -Cervical paraspinal muscle injection, 2 sites, bilateral knee first injection site was 1 cm from the midline of the cervical spine, 3 cm inferior to the lower border of the occipital protuberance. The second injection site was 1.5 cm superiorly and laterally to the first injection site.  -Trapezius muscle injection was performed at 3 sites, bilaterally. The first injection site was in the upper trapezius muscle halfway between the inflection point of the neck, and the acromion. The second injection site was one half way between the acromion and the first injection site. The third injection was done between the first injection site and the inflection point of the neck.   Will return for repeat injection in 3 months.   200 units of Botox was used, any Botox not injected was wasted. The patient tolerated the procedure well, there were no complications of the above procedure.

## 2019-10-19 NOTE — Telephone Encounter (Signed)
We received an approval for Nurtec 75 mg from [place] [facility] [employer]. I faxed the approval letter to [facility] pharmacies and spoke with the patient. I suggested she call [facility] if she does hear from them by tomorrow. She verbalized appreciation for the call. Received a receipt of confirmation for the fax.

## 2019-10-20 ENCOUNTER — Telehealth: Payer: Self-pay | Admitting: Neurology

## 2019-10-20 NOTE — Telephone Encounter (Signed)
Faxed response back to [facility] pharmacy to please fill the 8 tablets per 30 days.

## 2019-10-20 NOTE — Telephone Encounter (Signed)
Spoke with [employer] [facility] at [phone]. I let the representative know I had already faxed back a response advising to fill the 8 tablets per month per Dr Lucia Gaskins. She verbalized understanding.

## 2019-10-20 NOTE — Telephone Encounter (Signed)
Just let her know thanks.

## 2019-10-20 NOTE — Telephone Encounter (Signed)
Katie from [employer]	[place] [facility] [employer] called stating that the pt's Rimegepant Sulfate (NURTEC) 75 MG TBDP has been denied and she is wanting to know if provider will be filing an appeal. Please advise. [phone]

## 2019-10-20 NOTE — Telephone Encounter (Signed)
Received fax from [facility] pharmacy. Nurtec has been approved for 8 tablets for 30 days. Appeal would be needed to request 16 tablets per 30 days.

## 2019-10-21 ENCOUNTER — Other Ambulatory Visit: Payer: Self-pay | Admitting: Neurology

## 2019-10-21 DIAGNOSIS — R112 Nausea with vomiting, unspecified: Secondary | ICD-10-CM

## 2019-10-21 DIAGNOSIS — G43001 Migraine without aura, not intractable, with status migrainosus: Secondary | ICD-10-CM

## 2019-11-11 ENCOUNTER — Telehealth: Payer: Self-pay | Admitting: Neurology

## 2019-11-11 NOTE — Telephone Encounter (Signed)
Pt called, want to let Dr. Lucia Gaskins know headaches have gotten worse after last Botox. Headaches having been lasting 2 days. Would like to call from the nurse.

## 2019-11-14 NOTE — Telephone Encounter (Signed)
Offer her Thursday appointment I can speak on the phone with her so she doesn't have to come all the way down here

## 2019-11-14 NOTE — Telephone Encounter (Signed)
I returned the pt's call. She stated at the last botox visit she mentioned an increase in headaches. However after the botox session she has not improved. She is still getting headaches more often. About half are worse than usual and the other half are less severe and can be treated with Tylenol. I let her know a message would be sent to Dr Lucia Gaskins and then I would call her back with Dr Trevor Mace response. She verbalized appreciation.

## 2019-11-14 NOTE — Telephone Encounter (Signed)
I spoke with the Lindsey Fitzgerald and offered a telephone appt with Dr Lucia Gaskins for this Thurs 11/11 @ 2:00 pm. Lindsey Fitzgerald accepted and provided consent for our office to file this telephone visit with insurance. She verbalized appreciation for the call.

## 2019-11-17 ENCOUNTER — Telehealth (INDEPENDENT_AMBULATORY_CARE_PROVIDER_SITE_OTHER): Payer: BC Managed Care – PPO | Admitting: Neurology

## 2019-11-17 DIAGNOSIS — G43709 Chronic migraine without aura, not intractable, without status migrainosus: Secondary | ICD-10-CM | POA: Diagnosis not present

## 2019-11-17 MED ORDER — AJOVY 225 MG/1.5ML ~~LOC~~ SOAJ: 225.0000 mg | SUBCUTANEOUS | 4 refills | Status: DC

## 2019-11-17 NOTE — Progress Notes (Addendum)
[facility]    Provider:  Dr Lindsey Fitzgerald Referring Provider: No ref. provider found Primary Care Physician:  Patient, No Pcp Per   Virtual Visit via Telephone Note  I connected with Lindsey Fitzgerald on 11/17/19 at  2:00 PM EST by telephone and verified that I am speaking with the correct person using two identifiers.  Location: Patient: home Provider: office   I discussed the limitations, risks, security and privacy concerns of performing an evaluation and management service by telephone and the availability of in person appointments. I also discussed with the patient that there may be a patient responsible charge related to this service. The patient expressed understanding and agreed to proceed.  Follow Up Instructions:    I discussed the assessment and treatment plan with the patient. The patient was provided an opportunity to ask questions and all were answered. The patient agreed with the plan and demonstrated an understanding of the instructions.   The patient was advised to call back or seek an in-person evaluation if the symptoms worsen or if the condition fails to improve as anticipated.  I provided 15 minutes of non-face-to-face time during this encounter.   Lindsey Fitzgerald, Lindsey Heidelberger B, MD    Interval history 11/17/2019: Patient has been doing well on Botox for years since 2017 we see her every 3 months for botox. 4-5 months ago headaches started worsening, Getting them more frequent than before. Will try  Few months of Ajovy. She already takes Nurtec qod. She lives in [place] with her mom.    Meds tried: botox, fioricet, celexa, decadron iv, diclofenac, cymbalta, lexapro, advil, toradol inj, magnesium, medrol dosepak, reglan tab, pamelor, zofran, pred oral, compazine inj supp, propranolol ER,nurtec, topamax, trazodone, ubrelvy, depacon inj, verapamil  Interval history 02/09/2015: Patient here for follow up of migraines. On Inderal and Nortriptyline. No medication  overuse. Had double vision with topamax. Takes zofran and fioricet (sparingly) for acute management, due to family history of triptan side effects she declines them. Out of a month she has 20/30 headaches for at least 6 months. At least 10 are migraines a month. They start behind the left eye. Throbbing/pounding, they can be 7/10 in pain and last all day. She has to go into a dark room. Light and sound hurt, needs quiet and still. Can last up to 24 hours. +Nausea. Last night she had a severe headache.  This is making her stress out, no depression. Last semester was hard. She has already missed 3 classes because of this. This has been going on for at least 6 months or longer. No aura. She is a good condiadate for botox migraine therapy and will request. She woul dlike a migraine dog. Therapy and migraine dog.   The migraines started in 2nd grade. Father has migraines. Migraines start sometimes without an aura and the feel like general pain throughout head, at the back of the head, deep ehind eyes and something is burning a path from the back of her head to her eyes. Other times feels like someone is hitting her head with a hammer. She also gets pressure-type headaches. Headaches were 7-8/10 and would last from 30 minutes to days. Longest one was 4 days. Would have them 4-6 a week. +nausea. +phonophobia. Sleeping may help when it gets really bad. Only medication has really helped in the past. Triggers include weather changes such as bad thunderstorms, really cold or hot. She gets 8 hours of sleep. Is on regular sleep/wake cycle. MSG makes it worse as does  artificial sugar. She was started on topamax 25mg  at night which has improved headaches. 50mg  caused hand paresthesias. Butterbur didn't help. Not sure if magnesium helped.   Interval history 10/31/2014: Migraines are worsening. One a week that lasts 2-3 days. She is using the cambia and fioricet(sparingly) for acute management (she declines triptans due to a  family history of side effects). She is on the inderal. Will add nortriptyline at night which may help with her insomnia as well.  HISTORY OF PRESENT ILLNESS : Lindsey Fitzgerald, 24 year old female returns for follow-up. She began having a migraine Thursday, September 29 which lasted for 2 days and then it came back on Sunday Oct 2,2016 and has been persistent. She has not taken Cambia she has only been taking the Fioricet. She has been nauseated and takes Zofran she has had a lot of photophobia. She is a Wednesday at Oct 4,2016 she is getting 8 hours of sleep every night as lack of sleep is a migraine trigger for her. She has very few foods that are triggers and she has tried to avoid those these would include artificial sugar and MSG. She has also been taking a lot of ibuprofen and was cautioned against this due to rebound. She returns for reevaluation. Except for this last week of headaches her headache frequency is down from 4 times a week to 2 times a week. She continues to take Inderal LA as her preventive. She is afraid to use triptans as her father had cardiac arrest after using a triptan.  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 No side effects from the propranolol. Don't want to try a triptan due to bad reaction in her father. She has 1-2 smaller headaches every 1-2 weeks they last 2-3 hours and they can get a 4-5/10 just enough to be annoying. 4/month was 8/10. The cambia helps with acute management. She can have nausea with the migraines.   HPI: Lindsey Fitzgerald is a 24 y.o. female here as a follow up for migraines. She stopped the Topamax due to double vision. She is doing very well. Has had only 3 headaches. They lasted 2 hours or longer. She tried to sleep through them. Imitrex stopped her dad's heart when he was a teenager so she is very hesitant to try triptans. Toradol makes her hyper. Has not tried reglan. She uses fioricet and  cambia acutely.   The migraines started in 2nd grade. Father has migraines. Migraines start sometimes without an aura and the feel like general pain throughout head, at the back of the head, deep ehind eyes and something is burning a path from the back of her head to her eyes. Other times feels like someone is hitting her head with a hammer. She also gets pressure-type headaches. Headaches were 7-8/10 and would last from 30 minutes to days. Longest one was 4 days. Would have them 4-6 a week. +nausea. +phonophobia. Sleeping may help when it gets really bad. Only medication has really helped in the past. Triggers include weather changes such as bad thunderstorms, really cold or hot. She gets 8 hours of sleep. Is on regular sleep/wake cycle. MSG makes it worse as does artificial sugar. She was started on topamax 25mg  at night which has improved headaches. 50mg  caused hand paresthesias. Butterbur didn't help. Not sure if magnesium helped. Takes advil once a week for rescue. Doesn't take fioricet. No focal neurologic deficits during the headaches. Not sexually active. Not  interested in having children anytime soon. She just started [employer]  Review of Systems: Patient complains of symptoms per HPI as well as the following symptoms: headache, insomia. Pertinent negatives per HPI. All others negative.    Social History   Socioeconomic History  . Marital status: Single    Spouse name: Not on file  . Number of children: 0  . Years of education: 83  . Highest education level: Not on file  Occupational History  . Not on file  Tobacco Use  . Smoking status: Never Smoker  . Smokeless tobacco: Never Used  Vaping Use  . Vaping Use: Never used  Substance and Sexual Activity  . Alcohol use: Yes    Comment: occ  . Drug use: No  . Sexual activity: Yes    Partners: Male    Birth control/protection: Implant    Comment: nexplanon  Other Topics Concern  . Not on file  Social History Narrative   Patient is  single with no children.   Patient is right handed.   Patient has a high school education and currently in college.   Patient drinks caffeine occasionally.   Social Determinants of Health   Financial Resource Strain:   . Difficulty of Paying Living Expenses: Not on file  Food Insecurity:   . Worried About Programme researcher, broadcasting/film/video in the Last Year: Not on file  . Ran Out of Food in the Last Year: Not on file  Transportation Needs:   . Lack of Transportation (Medical): Not on file  . Lack of Transportation (Non-Medical): Not on file  Physical Activity:   . Days of Exercise per Week: Not on file  . Minutes of Exercise per Session: Not on file  Stress:   . Feeling of Stress : Not on file  Social Connections:   . Frequency of Communication with Friends and Family: Not on file  . Frequency of Social Gatherings with Friends and Family: Not on file  . Attends Religious Services: Not on file  . Active Member of Clubs or Organizations: Not on file  . Attends Banker Meetings: Not on file  . Marital Status: Not on file  Intimate Partner Violence:   . Fear of Current or Ex-Partner: Not on file  . Emotionally Abused: Not on file  . Physically Abused: Not on file  . Sexually Abused: Not on file    Family History  Problem Relation Age of Onset  . Diabetes Mother   . Hyperlipidemia Mother   . Hypertension Mother   . Migraines Mother   . Fibromyalgia Mother   . Migraines Father   . Other Father        Brain Tumor  . Stroke Maternal Grandmother   . Lupus Maternal Grandmother   . Stroke Maternal Grandfather   . Hypertension Maternal Grandfather   . Celiac disease Sister   . Diabetes Paternal Uncle     Past Medical History:  Diagnosis Date  . Migraine   . Premature baby   . Tension pneumothorax     Past Surgical History:  Procedure Laterality Date  . nexplanon      insertion 04-13-14  . None    . TYMPANOSTOMY TUBE PLACEMENT    . WISDOM TOOTH EXTRACTION       Current Outpatient Medications  Medication Sig Dispense Refill  . botulinum toxin Type A (BOTOX) 100 units SOLR injection MD TO INJECT 155 UNITS INTRAMUSCULARLY INTO THE HEAD AND NECK MUSCLES EVERY 3 MONTHS 2  each 3  . citalopram (CELEXA) 20 MG tablet Take 20 mg by mouth daily.    . Diclofenac Potassium,Migraine, (CAMBIA) 50 MG PACK Mix 1 pack with 3 oz of water twice daily as needed for migraine. 9 each 3  . EPINEPHrine 0.3 mg/0.3 mL IJ SOAJ injection Inject 0.3 mg into the skin daily as needed (allergic reaction).     . Fremanezumab-vfrm (AJOVY) 225 MG/1.5ML SOAJ Inject 225 mg into the skin every 30 (thirty) days. 4.5 mL 4  . ibuprofen (ADVIL,MOTRIN) 200 MG tablet Take 200 mg by mouth every 6 (six) hours as needed for headache, mild pain or moderate pain.    . Levonorgestrel-Ethinyl Estradiol (AMETHIA) 0.1-0.02 & 0.01 MG tablet TK 1 T PO QD    . ondansetron (ZOFRAN-ODT) 4 MG disintegrating tablet DISSOLVE 1 TABLET(4 MG) ON THE TONGUE EVERY 8 HOURS AS NEEDED FOR NAUSEA 30 tablet 11  . Rimegepant Sulfate (NURTEC) 75 MG TBDP Take 75 mg by mouth daily as needed. For migraines. Take as close to onset of migraine as possible. One daily maximum. 10 tablet 6  . Rimegepant Sulfate (NURTEC) 75 MG TBDP Take 75 mg by mouth every other day. 16 tablet 5  . Rimegepant Sulfate (NURTEC) 75 MG TBDP Take 75 mg by mouth daily as needed. For migraines. Take as close to onset of migraine as possible. One daily maximum. 16 tablet 0  . traZODone (DESYREL) 50 MG tablet Take 25 mg by mouth at bedtime as needed.     No current facility-administered medications for this visit.    Allergies as of 11/17/2019 - Review Complete 10/11/2019  Allergen Reaction Noted  . Mushroom extract complex Anaphylaxis 10/22/2014  . Other Swelling 08/21/2014  . Ketorolac tromethamine Other (See Comments) 08/26/2013  . Sumatriptan Other (See Comments) 08/26/2013    Vitals: There were no vitals taken for this visit. Last Weight:   Wt Readings from Last 1 Encounters:  10/05/18 155 lb 6.4 oz (70.5 kg)   Last Height:   Ht Readings from Last 1 Encounters:  10/05/18  (1.676 m)      ASSESSMENT AND PLAN  24 y.o. year old femalewith chronic migraines w/o aura with status migrainosus not intractable   Add ajovy for a few months continue Botox and current meds   PRIOR:  PLAN: As far as your medications are concerned, I would like to suggest:  Will request Botox for migraines Continue Zofran at the onset of headache Limit use of ibuprofen and fioricet as it can cause rebound headache, given information on rebound Continue propranolol ER as prescribed and increase Nortriptyline Remember to drink plenty of fluid, eat healthy meals and do not skip any meals. Try to eat protein with a every meal and eat a healthy snack such as fruit or nuts in between meals. Try to keep a regular sleep-wake schedule and try to exercise daily, particularly in the form of walking, 20-30 minutes a day, if you can.   Addendum 06/14/2015: Topamax with double vision, still on propranolol with nortriptyline (not working still with 20/30 headache days a month and superimposed migraines at least 15 a month), she is visiting the ED regularly, she never got the Sprix (will follow up), needs refills on cambia which helps, reglan helps a little bit, zofran heps with nausea, stopped refilling fioricet, fioricet didn't work, lexapro did not work, steroids, verapamil. Will increase Inderal and start Cymbalta.  Will increase nortriptyline at night which may help with her insomnia as well. No cardiac  problems. Discussed side effects including teratogenicity, do not Pregnant on this medication and use birth control. Serious side effects can include hypotension, hypertension, syncope, ventricular arrhythmias, QT prolongation and other cardiac side effects, stroke and seizures, ataxia tardive dyskinesias, extrapyramidal symptoms, increased intraocular  pressure, leukopenia, thrombocytopenia, hallucinations, suicidality and other serious side effects. Common reactions include drowsiness, dry mouth, dizziness, constipation, blurred vision, palpitations, tachycardia, impaired coordination, increased appetite, nausea vomiting, weakness, confusion, disorientation, restlessness, anxiety and other side effects.  To prevent or relieve headaches, try the following: Cool Compress. Lie down and place a cool compress on your head.  Avoid headache triggers. If certain foods or odors seem to have triggered your migraines in the past, avoid them. A headache diary might help you identify triggers.  Include physical activity in your daily routine. Try a daily walk or other moderate aerobic exercise.  Manage stress. Find healthy ways to cope with the stressors, such as delegating tasks on your to-do list.  Practice relaxation techniques. Try deep breathing, yoga, massage and visualization.  Eat regularly. Eating regularly scheduled meals and maintaining a healthy diet might help prevent headaches. Also, drink plenty of fluids.  Follow a regular sleep schedule. Sleep deprivation might contribute to headaches Consider biofeedback. With this mind-body technique, you learn to control certain bodily functions -- such as muscle tension, heart rate and blood pressure -- to prevent headaches or reduce headache pain.    Proceed to emergency room if you experience new or worsening symptoms or symptoms do not resolve, if you have new neurologic symptoms or if headache is severe, or for any concerning symptom.    Naomie Dean, MD  [facility] [address] [place], [place] [address]  Phone [phone] Fax [phone]  A total of 30 minutes was spent face-to-face with this patient. Over half this time was spent on counseling patient on the migraine diagnosis and different diagnostic and therapeutic options available.

## 2019-11-21 ENCOUNTER — Telehealth: Payer: Self-pay | Admitting: *Deleted

## 2019-11-21 NOTE — Telephone Encounter (Signed)
Completed Ajovy PA on Cover My Meds. Key: [id number] - Rx #: [id number]. Awaiting determination from [employer] within 72 hours.

## 2019-11-28 NOTE — Telephone Encounter (Signed)
Noted pt can use savings card.

## 2019-11-28 NOTE — Telephone Encounter (Signed)
Notification of denial in covermymeds.

## 2019-11-30 ENCOUNTER — Telehealth: Payer: Self-pay | Admitting: *Deleted

## 2019-11-30 ENCOUNTER — Encounter: Payer: Self-pay | Admitting: *Deleted

## 2019-11-30 NOTE — Telephone Encounter (Signed)
Cambia denied due to patient having not tried a triptan. Pt should be able to get this at a discount through savings program with [facility]. I faxed the denial letter to [facility] asking for assistance with this and to let us know if they need anything else from [place]. Received a receipt of confirmation. If they are unable/no longer have the program, then we may need to appeal this.   Appeal fax # [phone].

## 2019-11-30 NOTE — Telephone Encounter (Signed)
Cambia PA completed on Cover My Meds. Key: [id number]. Awaiting determination from [employer]. The prescription was sent to [facility] and they should have  Savings program to provide pt with discounted medication despite insurance determination.

## 2019-12-08 ENCOUNTER — Telehealth: Payer: Self-pay | Admitting: Neurology

## 2019-12-08 NOTE — Telephone Encounter (Signed)
Patient called me to advise that she will be getting new insurance for the 2022 year. She states it will be through [employer]. Patient states she will call back with the information once she receives it, but just wanted to notify us of the change coming.

## 2019-12-22 NOTE — Telephone Encounter (Signed)
Received (2) 100U vials of Botox from [facility] for patient's January appointment.

## 2020-01-10 NOTE — Telephone Encounter (Addendum)
Patient has an appointment on 1/11. Her insurance is no longer active. She has (2) 100U vials of Botox here from [facility]. I called her to get new insurance information. She states she does not have the information yet but is waiting to hear back from her work regarding it. She states she will let me know as soon as she can.

## 2020-01-17 ENCOUNTER — Ambulatory Visit (INDEPENDENT_AMBULATORY_CARE_PROVIDER_SITE_OTHER): Payer: No Typology Code available for payment source | Admitting: Neurology

## 2020-01-17 ENCOUNTER — Other Ambulatory Visit: Payer: Self-pay

## 2020-01-17 DIAGNOSIS — G43709 Chronic migraine without aura, not intractable, without status migrainosus: Secondary | ICD-10-CM

## 2020-01-17 NOTE — Progress Notes (Signed)
Consent Form Botulism Toxin Injection For Chronic Migraine  Interval history 01/17/2020: Patient was seen last month due to worsening headaches/migraines. We decided to try Ajovy temporarily because she had been doing so well on botox, was unusual. Arnetha Massy is great, she loves it, and feels better. >90% decrease in migraine frequency. Advised to use nurtec prn instead of qod   Interval history 10/11/2019: extremely well > 75% decrease migraine frequency. +a. She likes nurtec will order from specialty pharmacy  No orders of the defined types were placed in this encounter.  Consent Form Botulism Toxin Injection For Chronic Migraine    Reviewed orally with patient, additionally signature is on file:  Botulism toxin has been approved by the Federal drug administration for treatment of chronic migraine. Botulism toxin does not cure chronic migraine and it may not be effective in some patients.  The administration of botulism toxin is accomplished by injecting a small amount of toxin into the muscles of the neck and head. Dosage must be titrated for each individual. Any benefits resulting from botulism toxin tend to wear off after 3 months with a repeat injection required if benefit is to be maintained. Injections are usually done every 3-4 months with maximum effect peak achieved by about 2 or 3 weeks. Botulism toxin is expensive and you should be sure of what costs you will incur resulting from the injection.  The side effects of botulism toxin use for chronic migraine may include:   -Transient, and usually mild, facial weakness with facial injections  -Transient, and usually mild, head or neck weakness with head/neck injections  -Reduction or loss of forehead facial animation due to forehead muscle weakness  -Eyelid drooping  -Dry eye  -Pain at the site of injection or bruising at the site of injection  -Double vision  -Potential unknown long term risks  Contraindications: You should not  have Botox if you are pregnant, nursing, allergic to albumin, have an infection, skin condition, or muscle weakness at the site of the injection, or have myasthenia gravis, Lambert-Eaton syndrome, or ALS.  It is also possible that as with any injection, there may be an allergic reaction or no effect from the medication. Reduced effectiveness after repeated injections is sometimes seen and rarely infection at the injection site may occur. All care will be taken to prevent these side effects. If therapy is given over a long time, atrophy and wasting in the muscle injected may occur. Occasionally the patient's become refractory to treatment because they develop antibodies to the toxin. In this event, therapy needs to be modified.  I have read the above information and consent to the administration of botulism toxin.    BOTOX PROCEDURE NOTE FOR MIGRAINE HEADACHE    Contraindications and precautions discussed with patient(above). Aseptic procedure was observed and patient tolerated procedure. Procedure performed by Dr. Artemio Aly  The condition has existed for more than 6 months, and pt does not have a diagnosis of ALS, Myasthenia Gravis or Lambert-Eaton Syndrome.  Risks and benefits of injections discussed and pt agrees to proceed with the procedure.  Written consent obtained  These injections are medically necessary. Pt  receives good benefits from these injections. These injections do not cause sedations or hallucinations which the oral therapies may cause.  Description of procedure:  The patient was placed in a sitting position. The standard protocol was used for Botox as follows, with 5 units of Botox injected at each site:   -Procerus muscle, midline injection  -Corrugator muscle,  bilateral injection  -Frontalis muscle, bilateral injection, with 2 sites each side, medial injection was performed in the upper one third of the frontalis muscle, in the region vertical from the medial inferior  edge of the superior orbital rim. The lateral injection was again in the upper one third of the forehead vertically above the lateral limbus of the cornea, 1.5 cm lateral to the medial injection site.  -Temporalis muscle injection, 4 sites, bilaterally. The first injection was 3 cm above the tragus of the ear, second injection site was 1.5 cm to 3 cm up from the first injection site in line with the tragus of the ear. The third injection site was 1.5-3 cm forward between the first 2 injection sites. The fourth injection site was 1.5 cm posterior to the second injection site.   -Occipitalis muscle injection, 3 sites, bilaterally. The first injection was done one half way between the occipital protuberance and the tip of the mastoid process behind the ear. The second injection site was done lateral and superior to the first, 1 fingerbreadth from the first injection. The third injection site was 1 fingerbreadth superiorly and medially from the first injection site.  -Cervical paraspinal muscle injection, 2 sites, bilateral knee first injection site was 1 cm from the midline of the cervical spine, 3 cm inferior to the lower border of the occipital protuberance. The second injection site was 1.5 cm superiorly and laterally to the first injection site.  -Trapezius muscle injection was performed at 3 sites, bilaterally. The first injection site was in the upper trapezius muscle halfway between the inflection point of the neck, and the acromion. The second injection site was one half way between the acromion and the first injection site. The third injection was done between the first injection site and the inflection point of the neck.   Will return for repeat injection in 3 months.   200 units of Botox was used, any Botox not injected was wasted. The patient tolerated the procedure well, there were no complications of the above procedure.

## 2020-01-17 NOTE — Telephone Encounter (Signed)
Patient has Botox here from [facility] for her appointment today. Her new insurance requires her to use Building [employer]. We will not bill the Botox (Q9643) to her new insurance, only the procedure (83818).

## 2020-01-17 NOTE — Progress Notes (Signed)
Botox- 100 units x 2 vials Lot: [id number] Expiration: 02/2022 NDC: 6067-7034-03  Bacteriostatic 0.9% Sodium Chloride- 6mL total Lot: [id number] Expiration: 02/06/2021 NDC: 5909-3112-16  Dx: K44.695 S/P

## 2020-01-30 ENCOUNTER — Telehealth: Payer: Self-pay | Admitting: Neurology

## 2020-01-30 NOTE — Telephone Encounter (Signed)
Patient's next Botox appointment is 4/12. Patient had a change in insurance and she now has [facility], which will go through [employer]. I completed PA on Cover My Meds for Botox. The PA was denied because it appears Botox will go through the medical benefit, not pharmacy. I called [facility] and spoke with Gabby to discuss. She states she is unable to tell which benefit Botox is under without a prescription. She states an account will need to be created for patient. I provided her with patient demographics. She then transferred me to [facility] in the pharmacy and I provided patient's Botox prescription. Sadie transferred me to Reya to provide insurance information. She advised that patient's Botox will be billed medically. Medical benefit review takes 24-48 hours.

## 2020-02-09 NOTE — Telephone Encounter (Signed)
Received fax from [facility]. Ajovy is not a covered benefit and is therefore excluded from coverage. If appeal is desired, fax within 180 days to [phone]. Reference # [id number].

## 2020-02-09 NOTE — Telephone Encounter (Signed)
Received another Ajovy PA. Completed this on Cover My Meds. Key: [id number]. Awaiting determination from [facility].

## 2020-02-27 NOTE — Telephone Encounter (Signed)
Patient now has [employer]. We received a Cambia denial letter from [facility] stating Lindsey Fitzgerald is not a covered benefit and is therefore excluded from coverage. I faxed this letter to [facility] mail order pharmacy to request assistance with patient savings program. Received a receipt of confirmation.

## 2020-03-05 NOTE — Telephone Encounter (Signed)
I called [facility] to check the status of Botox order. I spoke with Byrd Hesselbach who states that the order has been sent to the financial assistance department due to patient having a high copay of $1,253.00. Patient is already enrolled in the Botox Savings Program, but apparently is not eligible to use the program with the insurance she has. Byrd Hesselbach states that the department is in the process of calling patient to get income information. Byrd Hesselbach states that I will be contacted once everything is processed.

## 2020-03-06 NOTE — Telephone Encounter (Signed)
I called the patient and spoke with her about the situation. She states that after she was told she was not able to use the savings program with this insurance, she called her HR department at work and was told that information seemed to be incorrect. She states she is supposed to receive a call back today about it.

## 2020-03-15 NOTE — Telephone Encounter (Signed)
Received call from Boneta Lucks at [facility] to schedule Botox delivery. Botox TBD 3/16.

## 2020-03-21 NOTE — Telephone Encounter (Signed)
Received (1) 200 unit vial of Botox today from [facility]. 

## 2020-04-17 ENCOUNTER — Ambulatory Visit (INDEPENDENT_AMBULATORY_CARE_PROVIDER_SITE_OTHER): Payer: No Typology Code available for payment source | Admitting: Neurology

## 2020-04-17 ENCOUNTER — Other Ambulatory Visit: Payer: Self-pay

## 2020-04-17 DIAGNOSIS — G43709 Chronic migraine without aura, not intractable, without status migrainosus: Secondary | ICD-10-CM | POA: Diagnosis not present

## 2020-04-17 NOTE — Progress Notes (Signed)
CHARGING BY TIME: 10 minutes.  Consent Form Botulism Toxin Injection For Chronic Migraine  Interval history 4/12/20222:+a.Marland Kitchen insurance not paying for botox or surgical procedures. Will charge by time 10 minutes. NOT ON AJOVY* or any cgrp, gave her a big rash on her arm asked her to try benadryl to see if it helps.   01/17/2020:  Patient was seen last month due to worsening headaches/migraines. We decided to try Ajovy temporarily because she had been doing so well on botox, was unusual. Arnetha Massy is great, she loves it, and feels better. >90% decrease in migraine frequency. Advised to use nurtec prn instead of qod.     Interval history 10/11/2019: extremely well > 75% decrease migraine frequency. +a. She likes nurtec will order from specialty pharmacy  No orders of the defined types were placed in this encounter.  Consent Form Botulism Toxin Injection For Chronic Migraine    Reviewed orally with patient, additionally signature is on file:  Botulism toxin has been approved by the Federal drug administration for treatment of chronic migraine. Botulism toxin does not cure chronic migraine and it may not be effective in some patients.  The administration of botulism toxin is accomplished by injecting a small amount of toxin into the muscles of the neck and head. Dosage must be titrated for each individual. Any benefits resulting from botulism toxin tend to wear off after 3 months with a repeat injection required if benefit is to be maintained. Injections are usually done every 3-4 months with maximum effect peak achieved by about 2 or 3 weeks. Botulism toxin is expensive and you should be sure of what costs you will incur resulting from the injection.  The side effects of botulism toxin use for chronic migraine may include:   -Transient, and usually mild, facial weakness with facial injections  -Transient, and usually mild, head or neck weakness with head/neck injections  -Reduction or loss of  forehead facial animation due to forehead muscle weakness  -Eyelid drooping  -Dry eye  -Pain at the site of injection or bruising at the site of injection  -Double vision  -Potential unknown long term risks  Contraindications: You should not have Botox if you are pregnant, nursing, allergic to albumin, have an infection, skin condition, or muscle weakness at the site of the injection, or have myasthenia gravis, Lambert-Eaton syndrome, or ALS.  It is also possible that as with any injection, there may be an allergic reaction or no effect from the medication. Reduced effectiveness after repeated injections is sometimes seen and rarely infection at the injection site may occur. All care will be taken to prevent these side effects. If therapy is given over a long time, atrophy and wasting in the muscle injected may occur. Occasionally the patient's become refractory to treatment because they develop antibodies to the toxin. In this event, therapy needs to be modified.  I have read the above information and consent to the administration of botulism toxin.    BOTOX PROCEDURE NOTE FOR MIGRAINE HEADACHE    Contraindications and precautions discussed with patient(above). Aseptic procedure was observed and patient tolerated procedure. Procedure performed by Dr. Artemio Aly  The condition has existed for more than 6 months, and pt does not have a diagnosis of ALS, Myasthenia Gravis or Lambert-Eaton Syndrome.  Risks and benefits of injections discussed and pt agrees to proceed with the procedure.  Written consent obtained  These injections are medically necessary. Pt  receives good benefits from these injections. These injections do not  cause sedations or hallucinations which the oral therapies may cause.  Description of procedure:  The patient was placed in a sitting position. The standard protocol was used for Botox as follows, with 5 units of Botox injected at each site:   -Procerus muscle,  midline injection  -Corrugator muscle, bilateral injection  -Frontalis muscle, bilateral injection, with 2 sites each side, medial injection was performed in the upper one third of the frontalis muscle, in the region vertical from the medial inferior edge of the superior orbital rim. The lateral injection was again in the upper one third of the forehead vertically above the lateral limbus of the cornea, 1.5 cm lateral to the medial injection site.  -Temporalis muscle injection, 4 sites, bilaterally. The first injection was 3 cm above the tragus of the ear, second injection site was 1.5 cm to 3 cm up from the first injection site in line with the tragus of the ear. The third injection site was 1.5-3 cm forward between the first 2 injection sites. The fourth injection site was 1.5 cm posterior to the second injection site.   -Occipitalis muscle injection, 3 sites, bilaterally. The first injection was done one half way between the occipital protuberance and the tip of the mastoid process behind the ear. The second injection site was done lateral and superior to the first, 1 fingerbreadth from the first injection. The third injection site was 1 fingerbreadth superiorly and medially from the first injection site.  -Cervical paraspinal muscle injection, 2 sites, bilateral knee first injection site was 1 cm from the midline of the cervical spine, 3 cm inferior to the lower border of the occipital protuberance. The second injection site was 1.5 cm superiorly and laterally to the first injection site.  -Trapezius muscle injection was performed at 3 sites, bilaterally. The first injection site was in the upper trapezius muscle halfway between the inflection point of the neck, and the acromion. The second injection site was one half way between the acromion and the first injection site. The third injection was done between the first injection site and the inflection point of the neck.   Will return for repeat  injection in 3 months.   200 units of Botox was used, any Botox not injected was wasted. The patient tolerated the procedure well, there were no complications of the above procedure.

## 2020-04-17 NOTE — Progress Notes (Signed)
Botox- 200 units x 1 vial Lot: [id number] Expiration: 10/2022 NDC: 0023-3921-02  Bacteriostatic 0.9% Sodium Chloride- 4mL total Lot: [id number] Expiration: 02/06/2021 NDC: 0409-1966-02  Dx: G43.709 SP  

## 2020-04-17 NOTE — Telephone Encounter (Signed)
Patient is coming in for Botox today. I called her insurance ([place]) at [phone]. I spoke with Sharee Pimple just to make sure all authorization requirements have been met. We obtained Botox from [facility] on 3/16. Sharee Pimple states no PA is required for [id number], but special sourcing is required through [employer] Reference [id number].

## 2020-07-17 ENCOUNTER — Ambulatory Visit: Payer: No Typology Code available for payment source | Admitting: Neurology

## 2020-07-17 ENCOUNTER — Other Ambulatory Visit: Payer: Self-pay

## 2020-07-17 DIAGNOSIS — G43709 Chronic migraine without aura, not intractable, without status migrainosus: Secondary | ICD-10-CM

## 2020-07-17 NOTE — Progress Notes (Signed)
Botox- 200 units x 1 vial Lot: [id number] Expiration: 01/2023 NDC: 0023-3921-02  Bacteriostatic 0.9% Sodium Chloride- 4mL total Lot: [id number] Expiration: 06/06/2021 NDC: 0409-1966-02  Dx: G43.709 S/P  

## 2020-07-17 NOTE — Progress Notes (Signed)
CHARGING BY TIME: 20 minutes.   I spent 20 minutes of face-to-face and non-face-to-face time with patient on the  1. Chronic migraine without aura without status migrainosus, not intractable    diagnosis.  This included previsit chart review, lab review, study review, order entry, electronic health record documentation, patient education on the different diagnostic and therapeutic options, counseling and coordination of care, risks and benefits of management, compliance, or risk factor reduction and procedure.   Consent Form Botulism Toxin Injection For Chronic Migraine  07/17/2020: stable  Interval history 4/12/20222:+a.Marland Kitchen insurance not paying for botox or surgical procedures. Will charge by time 10 minutes. NOT ON AJOVY* or any cgrp, gave her a big rash on her arm asked her to try benadryl to see if it helps.   01/17/2020:  Patient was seen last month due to worsening headaches/migraines. We decided to try Ajovy temporarily because she had been doing so well on botox, was unusual. Arnetha Massy is great, she loves it, and feels better. >90% decrease in migraine frequency. Advised to use nurtec prn instead of qod.     Interval history 10/11/2019: extremely well > 75% decrease migraine frequency. +a. She likes nurtec will order from specialty pharmacy  No orders of the defined types were placed in this encounter.  Consent Form Botulism Toxin Injection For Chronic Migraine    Reviewed orally with patient, additionally signature is on file:  Botulism toxin has been approved by the Federal drug administration for treatment of chronic migraine. Botulism toxin does not cure chronic migraine and it may not be effective in some patients.  The administration of botulism toxin is accomplished by injecting a small amount of toxin into the muscles of the neck and head. Dosage must be titrated for each individual. Any benefits resulting from botulism toxin tend to wear off after 3 months with a repeat  injection required if benefit is to be maintained. Injections are usually done every 3-4 months with maximum effect peak achieved by about 2 or 3 weeks. Botulism toxin is expensive and you should be sure of what costs you will incur resulting from the injection.  The side effects of botulism toxin use for chronic migraine may include:   -Transient, and usually mild, facial weakness with facial injections  -Transient, and usually mild, head or neck weakness with head/neck injections  -Reduction or loss of forehead facial animation due to forehead muscle weakness  -Eyelid drooping  -Dry eye  -Pain at the site of injection or bruising at the site of injection  -Double vision  -Potential unknown long term risks  Contraindications: You should not have Botox if you are pregnant, nursing, allergic to albumin, have an infection, skin condition, or muscle weakness at the site of the injection, or have myasthenia gravis, Lambert-Eaton syndrome, or ALS.  It is also possible that as with any injection, there may be an allergic reaction or no effect from the medication. Reduced effectiveness after repeated injections is sometimes seen and rarely infection at the injection site may occur. All care will be taken to prevent these side effects. If therapy is given over a long time, atrophy and wasting in the muscle injected may occur. Occasionally the patient's become refractory to treatment because they develop antibodies to the toxin. In this event, therapy needs to be modified.  I have read the above information and consent to the administration of botulism toxin.    BOTOX PROCEDURE NOTE FOR MIGRAINE HEADACHE    Contraindications and precautions discussed with  patient(above). Aseptic procedure was observed and patient tolerated procedure. Procedure performed by Dr. Artemio Aly  The condition has existed for more than 6 months, and pt does not have a diagnosis of ALS, Myasthenia Gravis or Lambert-Eaton  Syndrome.  Risks and benefits of injections discussed and pt agrees to proceed with the procedure.  Written consent obtained  These injections are medically necessary. Pt  receives good benefits from these injections. These injections do not cause sedations or hallucinations which the oral therapies may cause.  Description of procedure:  The patient was placed in a sitting position. The standard protocol was used for Botox as follows, with 5 units of Botox injected at each site:   -Procerus muscle, midline injection  -Corrugator muscle, bilateral injection  -Frontalis muscle, bilateral injection, with 2 sites each side, medial injection was performed in the upper one third of the frontalis muscle, in the region vertical from the medial inferior edge of the superior orbital rim. The lateral injection was again in the upper one third of the forehead vertically above the lateral limbus of the cornea, 1.5 cm lateral to the medial injection site.  -Temporalis muscle injection, 4 sites, bilaterally. The first injection was 3 cm above the tragus of the ear, second injection site was 1.5 cm to 3 cm up from the first injection site in line with the tragus of the ear. The third injection site was 1.5-3 cm forward between the first 2 injection sites. The fourth injection site was 1.5 cm posterior to the second injection site.   -Occipitalis muscle injection, 3 sites, bilaterally. The first injection was done one half way between the occipital protuberance and the tip of the mastoid process behind the ear. The second injection site was done lateral and superior to the first, 1 fingerbreadth from the first injection. The third injection site was 1 fingerbreadth superiorly and medially from the first injection site.  -Cervical paraspinal muscle injection, 2 sites, bilateral knee first injection site was 1 cm from the midline of the cervical spine, 3 cm inferior to the lower border of the occipital protuberance.  The second injection site was 1.5 cm superiorly and laterally to the first injection site.  -Trapezius muscle injection was performed at 3 sites, bilaterally. The first injection site was in the upper trapezius muscle halfway between the inflection point of the neck, and the acromion. The second injection site was one half way between the acromion and the first injection site. The third injection was done between the first injection site and the inflection point of the neck.   Will return for repeat injection in 3 months.   200 units of Botox was used, any Botox not injected was wasted. The patient tolerated the procedure well, there were no complications of the above procedure.

## 2020-07-17 NOTE — Telephone Encounter (Signed)
Patient has a Botox appointment today. I received (1) 200 unit vial of Botox from [facility].

## 2020-10-08 ENCOUNTER — Telehealth: Payer: Self-pay | Admitting: Neurology

## 2020-10-08 NOTE — Telephone Encounter (Signed)
Pt called, have a dental appt same day as Botox appt. Would like a call from the nurse to discuss if wisdom tooth extraction would have an effect on Botox scheduled on 10/23/20

## 2020-10-09 NOTE — Telephone Encounter (Signed)
Spoke to pt and her botox is 10-23-20 and wisdom teeth 10/19/20 .  I relayed that should not be a problem.  She appreciated call back.

## 2020-10-23 ENCOUNTER — Ambulatory Visit: Payer: No Typology Code available for payment source | Admitting: Neurology

## 2020-10-23 DIAGNOSIS — G43001 Migraine without aura, not intractable, with status migrainosus: Secondary | ICD-10-CM | POA: Diagnosis not present

## 2020-10-23 NOTE — Progress Notes (Signed)
Botox- 100 units x 2 vials Lot: [id number] Expiration: 07/2021 NDC: 7591-6384-66  Bacteriostatic 0.9% Sodium Chloride- 62mL total Lot: [id number] Expiration: 01/06/2022 NDC: 5993-5701-77  Dx:G43.709 SAMPLE

## 2020-10-23 NOTE — Progress Notes (Signed)
I spent 30 minutes of face-to-face and non-face-to-face time with patient on the  1. Migraine without aura and with status migrainosus, not intractable    diagnosis.  This included previsit chart review, lab review, study review, order entry, electronic health record documentation, patient education on the different diagnostic and therapeutic options, counseling and coordination of care, risks and benefits of management, compliance, or risk factor reduction  Medications tried: Botox, Celexa, Fioricet, Cambia, Cymbalta, Lexapro, Prozac, Ajovy, ibuprofen, ketorolac injections, mag oxide, Medrol Dosepaks, Reglan tablets, naproxen p.o., nortriptyline, Zofran p.o. and injections, prednisone tablets, Phenergan, Nurtec, Ubrelvy, Topamax, verapamil, propranolol  Meds ordered this encounter  Medications   Atogepant (QULIPTA) 60 MG TABS    Sig: Take 60 mg by mouth daily.    Dispense:  30 tablet    Refill:  11    Meds tried: Botox, Celexa, Cymbalta, Lexapro, Prozac, Ajovy, nortriptyline, Nurtec, Ubrelvy, Topamax, verapamil     Consent Form Botulism Toxin Injection For Chronic Migraine  October 24, 2020: Samples used today.  Will try and order qulipta for patient for residual migraines for migraines a month.  Reviewed orally with patient, additionally signature is on file:  Botulism toxin has been approved by the Federal drug administration for treatment of chronic migraine. Botulism toxin does not cure chronic migraine and it may not be effective in some patients.  The administration of botulism toxin is accomplished by injecting a small amount of toxin into the muscles of the neck and head. Dosage must be titrated for each individual. Any benefits resulting from botulism toxin tend to wear off after 3 months with a repeat injection required if benefit is to be maintained. Injections are usually done every 3-4 months with maximum effect peak achieved by about 2 or 3 weeks. Botulism toxin is  expensive and you should be sure of what costs you will incur resulting from the injection.  The side effects of botulism toxin use for chronic migraine may include:   -Transient, and usually mild, facial weakness with facial injections  -Transient, and usually mild, head or neck weakness with head/neck injections  -Reduction or loss of forehead facial animation due to forehead muscle weakness  -Eyelid drooping  -Dry eye  -Pain at the site of injection or bruising at the site of injection  -Double vision  -Potential unknown long term risks  Contraindications: You should not have Botox if you are pregnant, nursing, allergic to albumin, have an infection, skin condition, or muscle weakness at the site of the injection, or have myasthenia gravis, Lambert-Eaton syndrome, or ALS.  It is also possible that as with any injection, there may be an allergic reaction or no effect from the medication. Reduced effectiveness after repeated injections is sometimes seen and rarely infection at the injection site may occur. All care will be taken to prevent these side effects. If therapy is given over a long time, atrophy and wasting in the muscle injected may occur. Occasionally the patient's become refractory to treatment because they develop antibodies to the toxin. In this event, therapy needs to be modified.  I have read the above information and consent to the administration of botulism toxin.    BOTOX PROCEDURE NOTE FOR MIGRAINE HEADACHE    Contraindications and precautions discussed with patient(above). Aseptic procedure was observed and patient tolerated procedure. Procedure performed by Dr. Artemio Aly  The condition has existed for more than 6 months, and pt does not have a diagnosis of ALS, Myasthenia Gravis or Lambert-Eaton Syndrome.  Risks and benefits of injections discussed and pt agrees to proceed with the procedure.  Written consent obtained  These injections are medically necessary. Pt   receives good benefits from these injections. These injections do not cause sedations or hallucinations which the oral therapies may cause.  Description of procedure:  The patient was placed in a sitting position. The standard protocol was used for Botox as follows, with 5 units of Botox injected at each site:   -Procerus muscle, midline injection  -Corrugator muscle, bilateral injection  -Frontalis muscle, bilateral injection, with 2 sites each side, medial injection was performed in the upper one third of the frontalis muscle, in the region vertical from the medial inferior edge of the superior orbital rim. The lateral injection was again in the upper one third of the forehead vertically above the lateral limbus of the cornea, 1.5 cm lateral to the medial injection site.  -Temporalis muscle injection, 4 sites, bilaterally. The first injection was 3 cm above the tragus of the ear, second injection site was 1.5 cm to 3 cm up from the first injection site in line with the tragus of the ear. The third injection site was 1.5-3 cm forward between the first 2 injection sites. The fourth injection site was 1.5 cm posterior to the second injection site.   -Occipitalis muscle injection, 3 sites, bilaterally. The first injection was done one half way between the occipital protuberance and the tip of the mastoid process behind the ear. The second injection site was done lateral and superior to the first, 1 fingerbreadth from the first injection. The third injection site was 1 fingerbreadth superiorly and medially from the first injection site.  -Cervical paraspinal muscle injection, 2 sites, bilateral knee first injection site was 1 cm from the midline of the cervical spine, 3 cm inferior to the lower border of the occipital protuberance. The second injection site was 1.5 cm superiorly and laterally to the first injection site.  -Trapezius muscle injection was performed at 3 sites, bilaterally. The first  injection site was in the upper trapezius muscle halfway between the inflection point of the neck, and the acromion. The second injection site was one half way between the acromion and the first injection site. The third injection was done between the first injection site and the inflection point of the neck.   Will return for repeat injection in 3 months.   200 units of Botox was used, any Botox not injected was wasted. The patient tolerated the procedure well, there were no complications of the above procedure.

## 2020-10-24 MED ORDER — QULIPTA 60 MG PO TABS: 60.0000 mg | ORAL_TABLET | Freq: Every day | ORAL | 11 refills | Status: DC

## 2021-01-09 ENCOUNTER — Encounter: Payer: Self-pay | Admitting: Neurology

## 2021-01-09 DIAGNOSIS — G43709 Chronic migraine without aura, not intractable, without status migrainosus: Secondary | ICD-10-CM

## 2021-01-15 ENCOUNTER — Ambulatory Visit: Payer: No Typology Code available for payment source | Admitting: Neurology

## 2021-01-17 MED ORDER — BOTOX 200 UNITS IJ SOLR: INTRAMUSCULAR | 3 refills | Status: DC

## 2021-01-17 NOTE — Telephone Encounter (Signed)
Botox 200 unit prescription sent to [facility] in [place].

## 2021-01-17 NOTE — Telephone Encounter (Signed)
PA form placed in nurse pod for MD signature.

## 2021-01-17 NOTE — Telephone Encounter (Signed)
Called patient to discuss. I have let her know that I will be starting the authorization process with [employer], and she is aware that we will be utilizing [facility].

## 2021-01-23 NOTE — Telephone Encounter (Signed)
Called [facility] and provided PA information. They will process the prescription, and call pt to schedule delivery.

## 2021-01-23 NOTE — Telephone Encounter (Signed)
Received PA from [employer] PA#: [id number] (01/17/2021-12/18/2021).

## 2021-01-28 ENCOUNTER — Ambulatory Visit: Payer: BC Managed Care – PPO | Admitting: Neurology

## 2021-01-28 DIAGNOSIS — G43709 Chronic migraine without aura, not intractable, without status migrainosus: Secondary | ICD-10-CM

## 2021-01-28 NOTE — Progress Notes (Signed)
Consent Form Botulism Toxin Injection For Chronic Migraine  01/28/2021: >90% decrease in migraine frequency.    Consent Form Botulism Toxin Injection For Chronic Migraine    Reviewed orally with patient, additionally signature is on file:  Botulism toxin has been approved by the Federal drug administration for treatment of chronic migraine. Botulism toxin does not cure chronic migraine and it may not be effective in some patients.  The administration of botulism toxin is accomplished by injecting a small amount of toxin into the muscles of the neck and head. Dosage must be titrated for each individual. Any benefits resulting from botulism toxin tend to wear off after 3 months with a repeat injection required if benefit is to be maintained. Injections are usually done every 3-4 months with maximum effect peak achieved by about 2 or 3 weeks. Botulism toxin is expensive and you should be sure of what costs you will incur resulting from the injection.  The side effects of botulism toxin use for chronic migraine may include:   -Transient, and usually mild, facial weakness with facial injections  -Transient, and usually mild, head or neck weakness with head/neck injections  -Reduction or loss of forehead facial animation due to forehead muscle weakness  -Eyelid drooping  -Dry eye  -Pain at the site of injection or bruising at the site of injection  -Double vision  -Potential unknown long term risks  Contraindications: You should not have Botox if you are pregnant, nursing, allergic to albumin, have an infection, skin condition, or muscle weakness at the site of the injection, or have myasthenia gravis, Lambert-Eaton syndrome, or ALS.  It is also possible that as with any injection, there may be an allergic reaction or no effect from the medication. Reduced effectiveness after repeated injections is sometimes seen and rarely infection at the injection site may occur. All care will be  taken to prevent these side effects. If therapy is given over a long time, atrophy and wasting in the muscle injected may occur. Occasionally the patient's become refractory to treatment because they develop antibodies to the toxin. In this event, therapy needs to be modified.  I have read the above information and consent to the administration of botulism toxin.    BOTOX PROCEDURE NOTE FOR MIGRAINE HEADACHE    Contraindications and precautions discussed with patient(above). Aseptic procedure was observed and patient tolerated procedure. Procedure performed by Dr. Artemio Aly  The condition has existed for more than 6 months, and pt does not have a diagnosis of ALS, Myasthenia Gravis or Lambert-Eaton Syndrome.  Risks and benefits of injections discussed and pt agrees to proceed with the procedure.  Written consent obtained  These injections are medically necessary. Pt  receives good benefits from these injections. These injections do not cause sedations or hallucinations which the oral therapies may cause.  Description of procedure:  The patient was placed in a sitting position. The standard protocol was used for Botox as follows, with 5 units of Botox injected at each site:   -Procerus muscle, midline injection  -Corrugator muscle, bilateral injection  -Frontalis muscle, bilateral injection, with 2 sites each side, medial injection was performed in the upper one third of the frontalis muscle, in the region vertical from the medial inferior edge of the superior orbital rim. The lateral injection was again in the upper one third of the forehead vertically above the lateral limbus of the cornea, 1.5 cm lateral to the medial injection site.  -Temporalis muscle injection, 4 sites, bilaterally.  The first injection was 3 cm above the tragus of the ear, second injection site was 1.5 cm to 3 cm up from the first injection site in line with the tragus of the ear. The third injection site was 1.5-3  cm forward between the first 2 injection sites. The fourth injection site was 1.5 cm posterior to the second injection site.   -Occipitalis muscle injection, 3 sites, bilaterally. The first injection was done one half way between the occipital protuberance and the tip of the mastoid process behind the ear. The second injection site was done lateral and superior to the first, 1 fingerbreadth from the first injection. The third injection site was 1 fingerbreadth superiorly and medially from the first injection site.  -Cervical paraspinal muscle injection, 2 sites, bilateral knee first injection site was 1 cm from the midline of the cervical spine, 3 cm inferior to the lower border of the occipital protuberance. The second injection site was 1.5 cm superiorly and laterally to the first injection site.  -Trapezius muscle injection was performed at 3 sites, bilaterally. The first injection site was in the upper trapezius muscle halfway between the inflection point of the neck, and the acromion. The second injection site was one half way between the acromion and the first injection site. The third injection was done between the first injection site and the inflection point of the neck.   Will return for repeat injection in 3 months.   155 units of Botox was used, an45uy Botox not injected was wasted. The patient tolerated the procedure well, there were no complications of the above procedure.

## 2021-01-28 NOTE — Progress Notes (Signed)
Botox- 200 Units x 1 vial Lot: [id number] Expiration: 09/2023 NDC: 3151-7616-07   Bacteriostatic 0.9% Sodium Chloride- 84mL total Lot: [id number] Expiration: 01/06/2022 NDC: 2694-8546-27   Dx:G43.709 specialty

## 2021-03-26 ENCOUNTER — Telehealth: Payer: Self-pay | Admitting: Neurology

## 2021-03-26 DIAGNOSIS — G43709 Chronic migraine without aura, not intractable, without status migrainosus: Secondary | ICD-10-CM

## 2021-03-26 MED ORDER — BOTOX 200 UNITS IJ SOLR: INTRAMUSCULAR | 3 refills | Status: DC

## 2021-03-26 NOTE — Telephone Encounter (Signed)
Please send Botox Rx refill to [facility]. ?

## 2021-04-10 ENCOUNTER — Other Ambulatory Visit: Payer: Self-pay | Admitting: Neurology

## 2021-04-10 DIAGNOSIS — G43001 Migraine without aura, not intractable, with status migrainosus: Secondary | ICD-10-CM

## 2021-04-10 DIAGNOSIS — R112 Nausea with vomiting, unspecified: Secondary | ICD-10-CM

## 2021-04-17 NOTE — Telephone Encounter (Signed)
Received 1 200 unit vial of Botox from [facility]. ?

## 2021-04-23 ENCOUNTER — Ambulatory Visit: Payer: Self-pay | Admitting: Neurology

## 2021-04-23 ENCOUNTER — Ambulatory Visit: Payer: No Typology Code available for payment source | Admitting: Neurology

## 2021-04-30 ENCOUNTER — Ambulatory Visit: Payer: BC Managed Care – PPO | Admitting: Neurology

## 2021-04-30 DIAGNOSIS — G43709 Chronic migraine without aura, not intractable, without status migrainosus: Secondary | ICD-10-CM | POA: Diagnosis not present

## 2021-04-30 NOTE — Progress Notes (Signed)
? ? ?Consent Form ?Botulism Toxin Injection For Chronic Migraine ? ?04/30/2021: stable ?01/28/2021: >90% decrease in migraine frequency.  ? ? ?Consent Form ?Botulism Toxin Injection For Chronic Migraine ? ? ? ?Reviewed orally with patient, additionally signature is on file: ? ?Botulism toxin has been approved by the Federal drug administration for treatment of chronic migraine. Botulism toxin does not cure chronic migraine and it may not be effective in some patients. ? ?The administration of botulism toxin is accomplished by injecting a small amount of toxin into the muscles of the neck and head. Dosage must be titrated for each individual. Any benefits resulting from botulism toxin tend to wear off after 3 months with a repeat injection required if benefit is to be maintained. Injections are usually done every 3-4 months with maximum effect peak achieved by about 2 or 3 weeks. Botulism toxin is expensive and you should be sure of what costs you will incur resulting from the injection. ? ?The side effects of botulism toxin use for chronic migraine may include: ? ? -Transient, and usually mild, facial weakness with facial injections ? -Transient, and usually mild, head or neck weakness with head/neck injections ? -Reduction or loss of forehead facial animation due to forehead muscle weakness ? -Eyelid drooping ? -Dry eye ? -Pain at the site of injection or bruising at the site of injection ? -Double vision ? -Potential unknown long term risks ? ?Contraindications: You should not have Botox if you are pregnant, nursing, allergic to albumin, have an infection, skin condition, or muscle weakness at the site of the injection, or have myasthenia gravis, Lambert-Eaton syndrome, or ALS. ? ?It is also possible that as with any injection, there may be an allergic reaction or no effect from the medication. Reduced effectiveness after repeated injections is sometimes seen and rarely infection at the injection site may occur.  All care will be taken to prevent these side effects. If therapy is given over a long time, atrophy and wasting in the muscle injected may occur. Occasionally the patient's become refractory to treatment because they develop antibodies to the toxin. In this event, therapy needs to be modified. ? ?I have read the above information and consent to the administration of botulism toxin. ? ? ? ?BOTOX PROCEDURE NOTE FOR MIGRAINE HEADACHE ? ? ? ?Contraindications and precautions discussed with patient(above). Aseptic procedure was observed and patient tolerated procedure. Procedure performed by Dr. Georgia Dom ? ?The condition has existed for more than 6 months, and pt does not have a diagnosis of ALS, Myasthenia Gravis or Lambert-Eaton Syndrome.  Risks and benefits of injections discussed and pt agrees to proceed with the procedure.  Written consent obtained ? ?These injections are medically necessary. Pt  receives good benefits from these injections. These injections do not cause sedations or hallucinations which the oral therapies may cause. ? ?Description of procedure: ? ?The patient was placed in a sitting position. The standard protocol was used for Botox as follows, with 5 units of Botox injected at each site: ? ? ?-Procerus muscle, midline injection ? ?-Corrugator muscle, bilateral injection ? ?-Frontalis muscle, bilateral injection, with 2 sites each side, medial injection was performed in the upper one third of the frontalis muscle, in the region vertical from the medial inferior edge of the superior orbital rim. The lateral injection was again in the upper one third of the forehead vertically above the lateral limbus of the cornea, 1.5 cm lateral to the medial injection site. ? ?-Temporalis muscle injection, 4  sites, bilaterally. The first injection was 3 cm above the tragus of the ear, second injection site was 1.5 cm to 3 cm up from the first injection site in line with the tragus of the ear. The third injection  site was 1.5-3 cm forward between the first 2 injection sites. The fourth injection site was 1.5 cm posterior to the second injection site.  ? ?-Occipitalis muscle injection, 3 sites, bilaterally. The first injection was done one half way between the occipital protuberance and the tip of the mastoid process behind the ear. The second injection site was done lateral and superior to the first, 1 fingerbreadth from the first injection. The third injection site was 1 fingerbreadth superiorly and medially from the first injection site. ? ?-Cervical paraspinal muscle injection, 2 sites, bilateral knee first injection site was 1 cm from the midline of the cervical spine, 3 cm inferior to the lower border of the occipital protuberance. The second injection site was 1.5 cm superiorly and laterally to the first injection site. ? ?-Trapezius muscle injection was performed at 3 sites, bilaterally. The first injection site was in the upper trapezius muscle halfway between the inflection point of the neck, and the acromion. The second injection site was one half way between the acromion and the first injection site. The third injection was done between the first injection site and the inflection point of the neck. ? ? ?Will return for repeat injection in 3 months. ? ? ?155 units of Botox was used, an45uy Botox not injected was wasted. The patient tolerated the procedure well, there were no complications of the above procedure. ? ? ? ?

## 2021-04-30 NOTE — Progress Notes (Signed)
Botox consent signed ? ?Botox- 200 units x 1 vial ?Lot: [id number] ?Expiration: 10/2023 ?NDC: 3825-0539-76 ? ?0.9% Sodium Chloride- 60mL total ?Lot: [id number] ?Expiration: 02/06/2022 ?NDC: 7902-4097-35 ? ?Dx: H29.924 ?S/P ?

## 2021-06-27 ENCOUNTER — Encounter: Payer: Self-pay | Admitting: Neurology

## 2021-06-27 MED ORDER — METHYLPREDNISOLONE 4 MG PO TBPK: ORAL_TABLET | ORAL | 0 refills | Status: AC

## 2021-07-03 NOTE — Telephone Encounter (Signed)
Received (1) 200 unit vial of Botox from [facility]. 

## 2021-07-23 ENCOUNTER — Ambulatory Visit: Payer: BC Managed Care – PPO | Admitting: Neurology

## 2021-07-23 DIAGNOSIS — G43709 Chronic migraine without aura, not intractable, without status migrainosus: Secondary | ICD-10-CM

## 2021-07-23 MED ORDER — ONABOTULINUMTOXINA 200 UNITS IJ SOLR: 155.0000 [IU] | Freq: Once | INTRAMUSCULAR | Status: DC

## 2021-07-23 NOTE — Progress Notes (Signed)
Botox- 200 units x 1 vial Lot: [id number] Expiration: 02/2024 NDC: 2620-3559-74  Bacteriostatic 0.9% Sodium Chloride- 7mL total Lot: [id number] Expiration: 08/07/2022 NDC: 3646-8032-12  Dx: Y48.250 S/P

## 2021-07-23 NOTE — Progress Notes (Signed)
Consent Form Botulism Toxin Injection For Chronic Migraine  07/23/2021: stable. Doing great 04/30/2021: stable 01/28/2021: >90% decrease in migraine frequency.    Consent Form Botulism Toxin Injection For Chronic Migraine    Reviewed orally with patient, additionally signature is on file:  Botulism toxin has been approved by the Federal drug administration for treatment of chronic migraine. Botulism toxin does not cure chronic migraine and it may not be effective in some patients.  The administration of botulism toxin is accomplished by injecting a small amount of toxin into the muscles of the neck and head. Dosage must be titrated for each individual. Any benefits resulting from botulism toxin tend to wear off after 3 months with a repeat injection required if benefit is to be maintained. Injections are usually done every 3-4 months with maximum effect peak achieved by about 2 or 3 weeks. Botulism toxin is expensive and you should be sure of what costs you will incur resulting from the injection.  The side effects of botulism toxin use for chronic migraine may include:   -Transient, and usually mild, facial weakness with facial injections  -Transient, and usually mild, head or neck weakness with head/neck injections  -Reduction or loss of forehead facial animation due to forehead muscle weakness  -Eyelid drooping  -Dry eye  -Pain at the site of injection or bruising at the site of injection  -Double vision  -Potential unknown long term risks  Contraindications: You should not have Botox if you are pregnant, nursing, allergic to albumin, have an infection, skin condition, or muscle weakness at the site of the injection, or have myasthenia gravis, Lambert-Eaton syndrome, or ALS.  It is also possible that as with any injection, there may be an allergic reaction or no effect from the medication. Reduced effectiveness after repeated injections is sometimes seen and rarely infection at  the injection site may occur. All care will be taken to prevent these side effects. If therapy is given over a long time, atrophy and wasting in the muscle injected may occur. Occasionally the patient's become refractory to treatment because they develop antibodies to the toxin. In this event, therapy needs to be modified.  I have read the above information and consent to the administration of botulism toxin.    BOTOX PROCEDURE NOTE FOR MIGRAINE HEADACHE    Contraindications and precautions discussed with patient(above). Aseptic procedure was observed and patient tolerated procedure. Procedure performed by Dr. Artemio Aly  The condition has existed for more than 6 months, and pt does not have a diagnosis of ALS, Myasthenia Gravis or Lambert-Eaton Syndrome.  Risks and benefits of injections discussed and pt agrees to proceed with the procedure.  Written consent obtained  These injections are medically necessary. Pt  receives good benefits from these injections. These injections do not cause sedations or hallucinations which the oral therapies may cause.  Description of procedure:  The patient was placed in a sitting position. The standard protocol was used for Botox as follows, with 5 units of Botox injected at each site:   -Procerus muscle, midline injection  -Corrugator muscle, bilateral injection  -Frontalis muscle, bilateral injection, with 2 sites each side, medial injection was performed in the upper one third of the frontalis muscle, in the region vertical from the medial inferior edge of the superior orbital rim. The lateral injection was again in the upper one third of the forehead vertically above the lateral limbus of the cornea, 1.5 cm lateral to the medial injection site.  -  Temporalis muscle injection, 4 sites, bilaterally. The first injection was 3 cm above the tragus of the ear, second injection site was 1.5 cm to 3 cm up from the first injection site in line with the tragus  of the ear. The third injection site was 1.5-3 cm forward between the first 2 injection sites. The fourth injection site was 1.5 cm posterior to the second injection site.   -Occipitalis muscle injection, 3 sites, bilaterally. The first injection was done one half way between the occipital protuberance and the tip of the mastoid process behind the ear. The second injection site was done lateral and superior to the first, 1 fingerbreadth from the first injection. The third injection site was 1 fingerbreadth superiorly and medially from the first injection site.  -Cervical paraspinal muscle injection, 2 sites, bilateral knee first injection site was 1 cm from the midline of the cervical spine, 3 cm inferior to the lower border of the occipital protuberance. The second injection site was 1.5 cm superiorly and laterally to the first injection site.  -Trapezius muscle injection was performed at 3 sites, bilaterally. The first injection site was in the upper trapezius muscle halfway between the inflection point of the neck, and the acromion. The second injection site was one half way between the acromion and the first injection site. The third injection was done between the first injection site and the inflection point of the neck.   Will return for repeat injection in 3 months.   155 units of Botox was used, an45uy Botox not injected was wasted. The patient tolerated the procedure well, there were no complications of the above procedure.

## 2021-09-25 ENCOUNTER — Encounter: Payer: Self-pay | Admitting: Neurology

## 2021-09-25 NOTE — Telephone Encounter (Signed)
Melvenia Beam, MD  [facility] Calls 4 minutes ago (11:12 AM)    Yes we have had reports of qulipta causing heartburn. I would stop it for 1-2 months and see if that is the cause.

## 2021-09-30 ENCOUNTER — Other Ambulatory Visit: Payer: Self-pay | Admitting: Neurology

## 2021-09-30 DIAGNOSIS — G43001 Migraine without aura, not intractable, with status migrainosus: Secondary | ICD-10-CM

## 2021-10-16 ENCOUNTER — Ambulatory Visit: Payer: BC Managed Care – PPO | Admitting: Neurology

## 2021-10-29 ENCOUNTER — Ambulatory Visit: Payer: BC Managed Care – PPO | Admitting: Neurology

## 2021-10-29 DIAGNOSIS — G43709 Chronic migraine without aura, not intractable, without status migrainosus: Secondary | ICD-10-CM

## 2021-10-29 MED ORDER — ONABOTULINUMTOXINA 200 UNITS IJ SOLR
155.0000 [IU] | Freq: Once | INTRAMUSCULAR | Status: AC
  Administered 2021-10-29: 155 [IU] via INTRAMUSCULAR

## 2021-10-29 NOTE — Progress Notes (Signed)
Consent Form Botulism Toxin Injection For Chronic Migraine  10/29/2021: stable 07/23/2021: stable. Doing great 04/30/2021: stable 01/28/2021: >90% decrease in migraine frequency.    Consent Form Botulism Toxin Injection For Chronic Migraine    Reviewed orally with patient, additionally signature is on file:  Botulism toxin has been approved by the Federal drug administration for treatment of chronic migraine. Botulism toxin does not cure chronic migraine and it may not be effective in some patients.  The administration of botulism toxin is accomplished by injecting a small amount of toxin into the muscles of the neck and head. Dosage must be titrated for each individual. Any benefits resulting from botulism toxin tend to wear off after 3 months with a repeat injection required if benefit is to be maintained. Injections are usually done every 3-4 months with maximum effect peak achieved by about 2 or 3 weeks. Botulism toxin is expensive and you should be sure of what costs you will incur resulting from the injection.  The side effects of botulism toxin use for chronic migraine may include:   -Transient, and usually mild, facial weakness with facial injections  -Transient, and usually mild, head or neck weakness with head/neck injections  -Reduction or loss of forehead facial animation due to forehead muscle weakness  -Eyelid drooping  -Dry eye  -Pain at the site of injection or bruising at the site of injection  -Double vision  -Potential unknown long term risks  Contraindications: You should not have Botox if you are pregnant, nursing, allergic to albumin, have an infection, skin condition, or muscle weakness at the site of the injection, or have myasthenia gravis, Lambert-Eaton syndrome, or ALS.  It is also possible that as with any injection, there may be an allergic reaction or no effect from the medication. Reduced effectiveness after repeated injections is sometimes seen and  rarely infection at the injection site may occur. All care will be taken to prevent these side effects. If therapy is given over a long time, atrophy and wasting in the muscle injected may occur. Occasionally the patient's become refractory to treatment because they develop antibodies to the toxin. In this event, therapy needs to be modified.  I have read the above information and consent to the administration of botulism toxin.    BOTOX PROCEDURE NOTE FOR MIGRAINE HEADACHE    Contraindications and precautions discussed with patient(above). Aseptic procedure was observed and patient tolerated procedure. Procedure performed by Dr. Georgia Dom  The condition has existed for more than 6 months, and pt does not have a diagnosis of ALS, Myasthenia Gravis or Lambert-Eaton Syndrome.  Risks and benefits of injections discussed and pt agrees to proceed with the procedure.  Written consent obtained  These injections are medically necessary. Pt  receives good benefits from these injections. These injections do not cause sedations or hallucinations which the oral therapies may cause.  Description of procedure:  The patient was placed in a sitting position. The standard protocol was used for Botox as follows, with 5 units of Botox injected at each site:   -Procerus muscle, midline injection  -Corrugator muscle, bilateral injection  -Frontalis muscle, bilateral injection, with 2 sites each side, medial injection was performed in the upper one third of the frontalis muscle, in the region vertical from the medial inferior edge of the superior orbital rim. The lateral injection was again in the upper one third of the forehead vertically above the lateral limbus of the cornea, 1.5 cm lateral to the medial injection  site.  -Temporalis muscle injection, 4 sites, bilaterally. The first injection was 3 cm above the tragus of the ear, second injection site was 1.5 cm to 3 cm up from the first injection site in  line with the tragus of the ear. The third injection site was 1.5-3 cm forward between the first 2 injection sites. The fourth injection site was 1.5 cm posterior to the second injection site.   -Occipitalis muscle injection, 3 sites, bilaterally. The first injection was done one half way between the occipital protuberance and the tip of the mastoid process behind the ear. The second injection site was done lateral and superior to the first, 1 fingerbreadth from the first injection. The third injection site was 1 fingerbreadth superiorly and medially from the first injection site.  -Cervical paraspinal muscle injection, 2 sites, bilateral knee first injection site was 1 cm from the midline of the cervical spine, 3 cm inferior to the lower border of the occipital protuberance. The second injection site was 1.5 cm superiorly and laterally to the first injection site.  -Trapezius muscle injection was performed at 3 sites, bilaterally. The first injection site was in the upper trapezius muscle halfway between the inflection point of the neck, and the acromion. The second injection site was one half way between the acromion and the first injection site. The third injection was done between the first injection site and the inflection point of the neck.   Will return for repeat injection in 3 months.   155 units of Botox was used, an45uy Botox not injected was wasted. The patient tolerated the procedure well, there were no complications of the above procedure.

## 2021-10-29 NOTE — Progress Notes (Signed)
Botox- 200 units x 1 vial Lot: [id number] Expiration: 03/2024 NDC: 8341-9622-29  Bacteriostatic 0.9% Sodium Chloride- 40mL total Lot: [id number] Expiration: 09/07/2022 NDC: 1941-7408-14  Dx: G81.856 S/P

## 2021-11-06 ENCOUNTER — Encounter: Payer: Self-pay | Admitting: *Deleted

## 2021-11-07 ENCOUNTER — Telehealth: Payer: Self-pay | Admitting: Neurology

## 2021-11-07 DIAGNOSIS — G43709 Chronic migraine without aura, not intractable, without status migrainosus: Secondary | ICD-10-CM

## 2021-11-07 NOTE — Telephone Encounter (Signed)
PA completed on [employer]. Key: [id number]. Awaiting determination from [place].

## 2021-11-07 NOTE — Telephone Encounter (Signed)
Chasity @ [facility] called to report that the PA will expire on 12-17-21 CMM will be available for 30 days the Key is #[id number] if there are questions the call back # is [phone]

## 2021-11-07 NOTE — Telephone Encounter (Signed)
Effective from 11/07/2021 through 01/05/2022. Please note: After 01/05/2022, [facility] will be considered an [place] and will not be covered under the member's benefit plan. A separate prior authorization request will need to be submitted with an Chemical engineer.  Will need to call [employer] to check on why PA was only approved through 01/05/2022 and which SP will be in-network next year.

## 2021-11-13 ENCOUNTER — Encounter: Payer: Self-pay | Admitting: *Deleted

## 2021-11-13 NOTE — Telephone Encounter (Signed)
Called [employer] PA line and was transferred to Jackquline [place] Air traffic controller # [id number]). I was directed to [email or web address] which has a list of the current in-network SPs. I was told the update for 2024 should be made in December. We can do a PA then. I had also spoken with another representative who said since we just did a PA that we should wait 30 days before submitting another one or else it will likely be deemed duplicate and will be canceled. We will do a PA for 2024 next month.

## 2021-12-11 ENCOUNTER — Other Ambulatory Visit (HOSPITAL_COMMUNITY): Payer: Self-pay

## 2021-12-11 MED ORDER — BOTOX 200 UNITS IJ SOLR
INTRAMUSCULAR | 3 refills | Status: DC
  Filled 2021-12-11: qty 1, fill #0
  Filled 2022-01-20: qty 1, 30d supply, fill #0

## 2021-12-11 NOTE — Addendum Note (Signed)
Addended by: Bertram Savin on: 12/11/2021 02:00 PM   Modules accepted: Orders

## 2021-12-11 NOTE — Telephone Encounter (Addendum)
[employer]BS approved Botox 200 units from 12/11/2021 through 11/11/2022. Appt note updated. Botox Rx sent to [facility].

## 2021-12-11 NOTE — Telephone Encounter (Signed)
I have completed a new PA for Botox continuation but I selected [facility] as the servicing provider instead of [facility]. We are awaiting determination from [facility] [place] within the 72 hours. Key: [id number].

## 2021-12-12 ENCOUNTER — Other Ambulatory Visit (HOSPITAL_COMMUNITY): Payer: Self-pay

## 2022-01-20 ENCOUNTER — Other Ambulatory Visit (HOSPITAL_COMMUNITY): Payer: Self-pay

## 2022-01-20 NOTE — Telephone Encounter (Signed)
Spoke to patient made her aware we may  have to reschedule her appointment tomorrow due to her insurance changing to another speciality pharmacy. BCBS is closed today. Will call back Tomorrow  morning. Pt was very understanding and thanked me for calling

## 2022-01-21 ENCOUNTER — Ambulatory Visit: Payer: BC Managed Care – PPO | Admitting: Neurology

## 2022-01-21 ENCOUNTER — Other Ambulatory Visit (HOSPITAL_COMMUNITY): Payer: Self-pay

## 2022-01-21 DIAGNOSIS — G43709 Chronic migraine without aura, not intractable, without status migrainosus: Secondary | ICD-10-CM

## 2022-01-21 DIAGNOSIS — G43009 Migraine without aura, not intractable, without status migrainosus: Secondary | ICD-10-CM

## 2022-01-21 MED ORDER — NURTEC 75 MG PO TBDP: 75.0000 mg | ORAL_TABLET | ORAL | 11 refills | Status: DC | PRN

## 2022-01-21 NOTE — Progress Notes (Signed)
Consent Form Botulism Toxin Injection For Chronic Migraine  01/21/2022: Excellent. Baseline was 16-24 migraine days a month and daily headaches. Now 4 migraine days a month. <10 total headache days a month. failed Topamax (diplopia),triptans contraindicated, Propranolol, Nortriptyline, Ubrelvy, Ibuprofen, Ajovy, try nurtec prn acutely  Meds ordered this encounter  Medications   Rimegepant Sulfate (NURTEC) 75 MG TBDP    Sig: Take 75 mg by mouth as needed.    Dispense:  16 tablet    Refill:  11    4-6 migraine days a month. <10 total headache days a month. failed Topamax (diplopia),triptans contraindicated, Propranolol, Nortriptyline, Ubrelvy, Ibuprofen, Ajovy,    10/29/2021: stable 07/23/2021: stable. Doing great 04/30/2021: stable 01/28/2021: >90% decrease in migraine frequency.    Consent Form Botulism Toxin Injection For Chronic Migraine    Reviewed orally with patient, additionally signature is on file:  Botulism toxin has been approved by the Federal drug administration for treatment of chronic migraine. Botulism toxin does not cure chronic migraine and it may not be effective in some patients.  The administration of botulism toxin is accomplished by injecting a small amount of toxin into the muscles of the neck and head. Dosage must be titrated for each individual. Any benefits resulting from botulism toxin tend to wear off after 3 months with a repeat injection required if benefit is to be maintained. Injections are usually done every 3-4 months with maximum effect peak achieved by about 2 or 3 weeks. Botulism toxin is expensive and you should be sure of what costs you will incur resulting from the injection.  The side effects of botulism toxin use for chronic migraine may include:   -Transient, and usually mild, facial weakness with facial injections  -Transient, and usually mild, head or neck weakness with head/neck injections  -Reduction or loss of forehead facial  animation due to forehead muscle weakness  -Eyelid drooping  -Dry eye  -Pain at the site of injection or bruising at the site of injection  -Double vision  -Potential unknown long term risks  Contraindications: You should not have Botox if you are pregnant, nursing, allergic to albumin, have an infection, skin condition, or muscle weakness at the site of the injection, or have myasthenia gravis, Lambert-Eaton syndrome, or ALS.  It is also possible that as with any injection, there may be an allergic reaction or no effect from the medication. Reduced effectiveness after repeated injections is sometimes seen and rarely infection at the injection site may occur. All care will be taken to prevent these side effects. If therapy is given over a long time, atrophy and wasting in the muscle injected may occur. Occasionally the patient's become refractory to treatment because they develop antibodies to the toxin. In this event, therapy needs to be modified.  I have read the above information and consent to the administration of botulism toxin.    BOTOX PROCEDURE NOTE FOR MIGRAINE HEADACHE    Contraindications and precautions discussed with patient(above). Aseptic procedure was observed and patient tolerated procedure. Procedure performed by Dr. Georgia Dom  The condition has existed for more than 6 months, and pt does not have a diagnosis of ALS, Myasthenia Gravis or Lambert-Eaton Syndrome.  Risks and benefits of injections discussed and pt agrees to proceed with the procedure.  Written consent obtained  These injections are medically necessary. Pt  receives good benefits from these injections. These injections do not cause sedations or hallucinations which the oral therapies may cause.  Description of procedure:  The patient was placed in a sitting position. The standard protocol was used for Botox as follows, with 5 units of Botox injected at each site:   -Procerus muscle, midline  injection  -Corrugator muscle, bilateral injection  -Frontalis muscle, bilateral injection, with 2 sites each side, medial injection was performed in the upper one third of the frontalis muscle, in the region vertical from the medial inferior edge of the superior orbital rim. The lateral injection was again in the upper one third of the forehead vertically above the lateral limbus of the cornea, 1.5 cm lateral to the medial injection site.  -Temporalis muscle injection, 4 sites, bilaterally. The first injection was 3 cm above the tragus of the ear, second injection site was 1.5 cm to 3 cm up from the first injection site in line with the tragus of the ear. The third injection site was 1.5-3 cm forward between the first 2 injection sites. The fourth injection site was 1.5 cm posterior to the second injection site.   -Occipitalis muscle injection, 3 sites, bilaterally. The first injection was done one half way between the occipital protuberance and the tip of the mastoid process behind the ear. The second injection site was done lateral and superior to the first, 1 fingerbreadth from the first injection. The third injection site was 1 fingerbreadth superiorly and medially from the first injection site.  -Cervical paraspinal muscle injection, 2 sites, bilateral knee first injection site was 1 cm from the midline of the cervical spine, 3 cm inferior to the lower border of the occipital protuberance. The second injection site was 1.5 cm superiorly and laterally to the first injection site.  -Trapezius muscle injection was performed at 3 sites, bilaterally. The first injection site was in the upper trapezius muscle halfway between the inflection point of the neck, and the acromion. The second injection site was one half way between the acromion and the first injection site. The third injection was done between the first injection site and the inflection point of the neck.   Will return for repeat injection  in 3 months.   155 units of Botox was used, an45uy Botox not injected was wasted. The patient tolerated the procedure well, there were no complications of the above procedure.

## 2022-01-21 NOTE — Telephone Encounter (Addendum)
[facility] is trying to fill pt's Botox for today's appointment but they are getting a rejection that Botox is not covered. I have completed an urgent PA on CMM attaching [facility] SP to the request Key: [id number]. Will update as soon as possible. If patient's insurance doesn't approve in time, per Dr Jaynee Eagles, we will use samples.   Pt will plan to come today. She will call her insurance to find out why they are saying Botox is not a covered benefit. She states she was specifically told at the end of 2023 that nothing was changing and she was still good to proceed with Botox. She will provide [place] with an update. She verbalized appreciation for the call.

## 2022-01-21 NOTE — Telephone Encounter (Signed)
Spoke with [employer]. Unfortunately they are still getting the same rejection when trying to fill Botox even though PA says approved. We will have to call BCBS to inquire. Will use samples today for visit per Dr Jaynee Eagles.

## 2022-01-21 NOTE — Progress Notes (Signed)
Botox- 200 units x 1 vial Lot: [id number] Expiration: 03/2024 NDC: 4920-1007-12  Bacteriostatic 0.9% Sodium Chloride- 53mL total Lot: [id number] Expiration: 11/2023 NDC: 25498-264-15  Dx: A30.940 Samples

## 2022-02-03 ENCOUNTER — Other Ambulatory Visit (HOSPITAL_COMMUNITY): Payer: Self-pay

## 2022-02-04 ENCOUNTER — Other Ambulatory Visit (HOSPITAL_COMMUNITY): Payer: Self-pay

## 2022-02-04 NOTE — Telephone Encounter (Addendum)
I spent about 1.5 hours on the phone with BCBS about this patient's botox authorization and I was told [facility] should be able to fill this Botox with no problem. They confirmed the authorization is on file. The pharmacy should file this under the pt's medical benefit. The copay is $60. Representative was Pernell Dupre reference # [id number].  Dr Jaynee Eagles ended up using samples at pt's last appt so her next appt won't be until April. We should be able to fill the Botox now though. Can you try again?

## 2022-02-04 NOTE — Telephone Encounter (Signed)
I called [employer]BS at [phone] and spoke with [place] M. She reports the PA was approved but it wasn't placed into the system specifically where the pharmacy is able to fill the Botox. She has escalated this request to her supervisor and will call us back to let us know it has been complete. Call reference number is [id number].

## 2022-02-11 NOTE — Telephone Encounter (Signed)
Noted thanks. I updated pt's next appt note to show we will do B/B.

## 2022-02-11 NOTE — Telephone Encounter (Signed)
[facility] Can be done

## 2022-02-18 ENCOUNTER — Telehealth: Payer: Self-pay | Admitting: Neurology

## 2022-02-18 NOTE — Telephone Encounter (Signed)
I called pt.  She received her botox injections 01-21-2022.  She noted 2 wks out of having some neck muscle weakness. Having noted it on both sides.  Feels really heavy / tired (like bowling ball on a pin).  She did go to urgent care this am, they said was pinched nerve (coincidental to botox).  Ordered muscle relaxant , may go to dry needling if still really sore next week.  She want to let us know.  If anything in addition will let her know.

## 2022-02-18 NOTE — Telephone Encounter (Signed)
Pt called this morning stating that when she woke up she was having muscle weakness and is barely able to move her head due to the Botox inj and she would like to discuss with RN

## 2022-02-19 NOTE — Telephone Encounter (Signed)
Noted thanks.

## 2022-04-22 ENCOUNTER — Ambulatory Visit: Payer: BC Managed Care – PPO | Admitting: Neurology

## 2022-04-22 DIAGNOSIS — G43009 Migraine without aura, not intractable, without status migrainosus: Secondary | ICD-10-CM

## 2022-04-22 DIAGNOSIS — G43709 Chronic migraine without aura, not intractable, without status migrainosus: Secondary | ICD-10-CM | POA: Diagnosis not present

## 2022-04-22 MED ORDER — ONABOTULINUMTOXINA 200 UNITS IJ SOLR
155.0000 [IU] | Freq: Once | INTRAMUSCULAR | Status: AC
  Administered 2022-04-22: 155 [IU] via INTRAMUSCULAR

## 2022-04-22 MED ORDER — NURTEC 75 MG PO TBDP: 75.0000 mg | ORAL_TABLET | ORAL | 11 refills | Status: DC | PRN

## 2022-04-22 NOTE — Progress Notes (Signed)
Botox- 200 units x 1 vial Lot: [id number] Expiration: 06/2024 NDC: 1478-2956-21  Bacteriostatic 0.9% Sodium Chloride- 4 mL total Lot: [id number] Expiration: 11/2023 NDC: 46962-952-84  Dx: G43.009 B/B Witnessed by Duffy Bruce ,CMA

## 2022-04-22 NOTE — Progress Notes (Signed)
Consent Form Botulism Toxin Injection For Chronic Migraine  04/22/2022: stable 01/21/2022: Excellent. Baseline was 16-24 migraine days a month and daily headaches. Now 4 migraine days a month. <10 total headache days a month. failed Topamax (diplopia),triptans contraindicated, Propranolol, Nortriptyline, Ubrelvy, Ibuprofen, Ajovy, try nurtec prn acutely  Meds ordered this encounter  Medications   botulinum toxin Type A (BOTOX) injection 155 Units    Botox- 200 units x 1 vial Lot: [id number] Expiration: 06/2024 NDC: 4540-9811-91  Bacteriostatic 0.9% Sodium Chloride- 4 mL total Lot: [id number] Expiration: 11/2023 NDC: 21308-657-84  Dx: G43.009 B/B Witnessed by Duffy Bruce ,CMA    10/29/2021: stable 07/23/2021: stable. Doing great 04/30/2021: stable 01/28/2021: >90% decrease in migraine frequency.    Consent Form Botulism Toxin Injection For Chronic Migraine    Reviewed orally with patient, additionally signature is on file:  Botulism toxin has been approved by the Federal drug administration for treatment of chronic migraine. Botulism toxin does not cure chronic migraine and it may not be effective in some patients.  The administration of botulism toxin is accomplished by injecting a small amount of toxin into the muscles of the neck and head. Dosage must be titrated for each individual. Any benefits resulting from botulism toxin tend to wear off after 3 months with a repeat injection required if benefit is to be maintained. Injections are usually done every 3-4 months with maximum effect peak achieved by about 2 or 3 weeks. Botulism toxin is expensive and you should be sure of what costs you will incur resulting from the injection.  The side effects of botulism toxin use for chronic migraine may include:   -Transient, and usually mild, facial weakness with facial injections  -Transient, and usually mild, head or neck weakness with head/neck injections  -Reduction or loss of  forehead facial animation due to forehead muscle weakness  -Eyelid drooping  -Dry eye  -Pain at the site of injection or bruising at the site of injection  -Double vision  -Potential unknown long term risks  Contraindications: You should not have Botox if you are pregnant, nursing, allergic to albumin, have an infection, skin condition, or muscle weakness at the site of the injection, or have myasthenia gravis, Lambert-Eaton syndrome, or ALS.  It is also possible that as with any injection, there may be an allergic reaction or no effect from the medication. Reduced effectiveness after repeated injections is sometimes seen and rarely infection at the injection site may occur. All care will be taken to prevent these side effects. If therapy is given over a long time, atrophy and wasting in the muscle injected may occur. Occasionally the patient's become refractory to treatment because they develop antibodies to the toxin. In this event, therapy needs to be modified.  I have read the above information and consent to the administration of botulism toxin.    BOTOX PROCEDURE NOTE FOR MIGRAINE HEADACHE    Contraindications and precautions discussed with patient(above). Aseptic procedure was observed and patient tolerated procedure. Procedure performed by Dr. Artemio Aly  The condition has existed for more than 6 months, and pt does not have a diagnosis of ALS, Myasthenia Gravis or Lambert-Eaton Syndrome.  Risks and benefits of injections discussed and pt agrees to proceed with the procedure.  Written consent obtained  These injections are medically necessary. Pt  receives good benefits from these injections. These injections do not cause sedations or hallucinations which the oral therapies may cause.  Description of procedure:  The patient was placed in  a sitting position. The standard protocol was used for Botox as follows, with 5 units of Botox injected at each site:   -Procerus muscle,  midline injection  -Corrugator muscle, bilateral injection  -Frontalis muscle, bilateral injection, with 2 sites each side, medial injection was performed in the upper one third of the frontalis muscle, in the region vertical from the medial inferior edge of the superior orbital rim. The lateral injection was again in the upper one third of the forehead vertically above the lateral limbus of the cornea, 1.5 cm lateral to the medial injection site.  -Temporalis muscle injection, 4 sites, bilaterally. The first injection was 3 cm above the tragus of the ear, second injection site was 1.5 cm to 3 cm up from the first injection site in line with the tragus of the ear. The third injection site was 1.5-3 cm forward between the first 2 injection sites. The fourth injection site was 1.5 cm posterior to the second injection site.   -Occipitalis muscle injection, 3 sites, bilaterally. The first injection was done one half way between the occipital protuberance and the tip of the mastoid process behind the ear. The second injection site was done lateral and superior to the first, 1 fingerbreadth from the first injection. The third injection site was 1 fingerbreadth superiorly and medially from the first injection site.  -Cervical paraspinal muscle injection, 2 sites, bilateral knee first injection site was 1 cm from the midline of the cervical spine, 3 cm inferior to the lower border of the occipital protuberance. The second injection site was 1.5 cm superiorly and laterally to the first injection site.  -Trapezius muscle injection was performed at 3 sites, bilaterally. The first injection site was in the upper trapezius muscle halfway between the inflection point of the neck, and the acromion. The second injection site was one half way between the acromion and the first injection site. The third injection was done between the first injection site and the inflection point of the neck.   Will return for repeat  injection in 3 months.   155 units of Botox was used, an45uy Botox not injected was wasted. The patient tolerated the procedure well, there were no complications of the above procedure.

## 2022-05-22 ENCOUNTER — Other Ambulatory Visit: Payer: Self-pay | Admitting: Neurology

## 2022-05-22 DIAGNOSIS — G43009 Migraine without aura, not intractable, without status migrainosus: Secondary | ICD-10-CM

## 2022-05-29 ENCOUNTER — Other Ambulatory Visit (HOSPITAL_COMMUNITY): Payer: Self-pay

## 2022-05-29 ENCOUNTER — Telehealth: Payer: Self-pay

## 2022-05-29 NOTE — Telephone Encounter (Signed)
Pharmacy Patient Advocate Encounter   Received notification from [facility] that prior authorization for Nurtec 75MG  dispersible tablets is required/requested.   PA submitted on 05/29/2022 to (ins) [employer] [place] via [employer] or [facility]) confirmation # [id number] Status is pending

## 2022-06-02 NOTE — Telephone Encounter (Signed)
Pharmacy Patient Advocate Encounter  Received notification from [facility] [employer] that the request for prior authorization for Nurtec 75MG  dispersible tablets has been denied due to see below.   Please be advised we currently do not have a Pharmacist to review denials, therefore you will need to process appeals accordingly as needed. Thanks for your support at this time.  The denial letter has been placed into the chart  Do you wish to appeal or would you like me to submit a new PA for the quantity of 8 tabs per 30 days? Please advise-

## 2022-06-03 ENCOUNTER — Other Ambulatory Visit (HOSPITAL_COMMUNITY): Payer: Self-pay

## 2022-06-03 NOTE — Telephone Encounter (Signed)
Pharmacy Patient Advocate Encounter   Received notification from [facility] that prior authorization for Nurtec 75MG  dispersible tablets is required/requested.   PA submitted on 06/03/2022 to (ins) [employer] via Cover[employer]Meds [employer] or (Medicaid) confirmation # [id number] Status is pending

## 2022-06-05 ENCOUNTER — Other Ambulatory Visit (HOSPITAL_COMMUNITY): Payer: Self-pay

## 2022-06-05 NOTE — Telephone Encounter (Signed)
Received a letter via fax from [employer] stating they do not handle the claims for this insurance-I called [employer] and they stated there was a mix up with the PCN/PBN names with CMM and to resubmit-  Pharmacy Patient Advocate Encounter   Received notification that prior authorization for Nurtec 75MG  dispersible tablets is required/requested.   PA submitted to [facility] via CoverMyMeds Key or (Medicaid) confirmation # [id number] Status is pending

## 2022-06-07 NOTE — Telephone Encounter (Signed)
Patient Advocate Encounter  Prior Authorization for Nurtec 75MG  dispersible tablets has been approved.    PA# [id number] Insurance [employer] Electronic Request Form Effective dates: 06/06/2022 through 06/05/2023

## 2022-07-22 ENCOUNTER — Ambulatory Visit: Payer: BC Managed Care – PPO | Admitting: Neurology

## 2022-07-24 ENCOUNTER — Ambulatory Visit: Payer: BC Managed Care – PPO | Admitting: Neurology

## 2022-07-24 DIAGNOSIS — G43709 Chronic migraine without aura, not intractable, without status migrainosus: Secondary | ICD-10-CM | POA: Diagnosis not present

## 2022-07-24 MED ORDER — ONABOTULINUMTOXINA 200 UNITS IJ SOLR
155.0000 [IU] | Freq: Once | INTRAMUSCULAR | Status: AC
Start: 2022-07-24 — End: 2022-07-24
  Administered 2022-07-24: 155 [IU] via INTRAMUSCULAR

## 2022-07-24 NOTE — Progress Notes (Unsigned)
Consent Form Botulism Toxin Injection For Chronic Migraine  07/24/2022: stable, doing great 04/22/2022: stable 01/21/2022: Excellent. Baseline was 16-24 migraine days a month and daily headaches. Now 4 migraine days a month. <10 total headache days a month. failed Topamax (diplopia),triptans contraindicated, Propranolol, Nortriptyline, Ubrelvy, Ibuprofen, Ajovy, try nurtec prn acutely  No orders of the defined types were placed in this encounter.   10/29/2021: stable 07/23/2021: stable. Doing great 04/30/2021: stable 01/28/2021: >90% decrease in migraine frequency.    Consent Form Botulism Toxin Injection For Chronic Migraine    Reviewed orally with patient, additionally signature is on file:  Botulism toxin has been approved by the Federal drug administration for treatment of chronic migraine. Botulism toxin does not cure chronic migraine and it may not be effective in some patients.  The administration of botulism toxin is accomplished by injecting a small amount of toxin into the muscles of the neck and head. Dosage must be titrated for each individual. Any benefits resulting from botulism toxin tend to wear off after 3 months with a repeat injection required if benefit is to be maintained. Injections are usually done every 3-4 months with maximum effect peak achieved by about 2 or 3 weeks. Botulism toxin is expensive and you should be sure of what costs you will incur resulting from the injection.  The side effects of botulism toxin use for chronic migraine may include:   -Transient, and usually mild, facial weakness with facial injections  -Transient, and usually mild, head or neck weakness with head/neck injections  -Reduction or loss of forehead facial animation due to forehead muscle weakness  -Eyelid drooping  -Dry eye  -Pain at the site of injection or bruising at the site of injection  -Double vision  -Potential unknown long term risks  Contraindications: You should  not have Botox if you are pregnant, nursing, allergic to albumin, have an infection, skin condition, or muscle weakness at the site of the injection, or have myasthenia gravis, Lambert-Eaton syndrome, or ALS.  It is also possible that as with any injection, there may be an allergic reaction or no effect from the medication. Reduced effectiveness after repeated injections is sometimes seen and rarely infection at the injection site may occur. All care will be taken to prevent these side effects. If therapy is given over a long time, atrophy and wasting in the muscle injected may occur. Occasionally the patient's become refractory to treatment because they develop antibodies to the toxin. In this event, therapy needs to be modified.  I have read the above information and consent to the administration of botulism toxin.    BOTOX PROCEDURE NOTE FOR MIGRAINE HEADACHE    Contraindications and precautions discussed with patient(above). Aseptic procedure was observed and patient tolerated procedure. Procedure performed by Dr. Artemio Aly  The condition has existed for more than 6 months, and pt does not have a diagnosis of ALS, Myasthenia Gravis or Lambert-Eaton Syndrome.  Risks and benefits of injections discussed and pt agrees to proceed with the procedure.  Written consent obtained  These injections are medically necessary. Pt  receives good benefits from these injections. These injections do not cause sedations or hallucinations which the oral therapies may cause.  Description of procedure:  The patient was placed in a sitting position. The standard protocol was used for Botox as follows, with 5 units of Botox injected at each site:   -Procerus muscle, midline injection  -Corrugator muscle, bilateral injection  -Frontalis muscle, bilateral injection, with 2 sites  each side, medial injection was performed in the upper one third of the frontalis muscle, in the region vertical from the medial  inferior edge of the superior orbital rim. The lateral injection was again in the upper one third of the forehead vertically above the lateral limbus of the cornea, 1.5 cm lateral to the medial injection site.  -Temporalis muscle injection, 4 sites, bilaterally. The first injection was 3 cm above the tragus of the ear, second injection site was 1.5 cm to 3 cm up from the first injection site in line with the tragus of the ear. The third injection site was 1.5-3 cm forward between the first 2 injection sites. The fourth injection site was 1.5 cm posterior to the second injection site.   -Occipitalis muscle injection, 3 sites, bilaterally. The first injection was done one half way between the occipital protuberance and the tip of the mastoid process behind the ear. The second injection site was done lateral and superior to the first, 1 fingerbreadth from the first injection. The third injection site was 1 fingerbreadth superiorly and medially from the first injection site.  -Cervical paraspinal muscle injection, 2 sites, bilateral knee first injection site was 1 cm from the midline of the cervical spine, 3 cm inferior to the lower border of the occipital protuberance. The second injection site was 1.5 cm superiorly and laterally to the first injection site.  -Trapezius muscle injection was performed at 3 sites, bilaterally. The first injection site was in the upper trapezius muscle halfway between the inflection point of the neck, and the acromion. The second injection site was one half way between the acromion and the first injection site. The third injection was done between the first injection site and the inflection point of the neck.   Will return for repeat injection in 3 months.   155 units of Botox was used, an45uy Botox not injected was wasted. The patient tolerated the procedure well, there were no complications of the above procedure.

## 2022-07-24 NOTE — Progress Notes (Unsigned)
Botox- 200 units x 1 vial Lot: [id number] Expiration: 12/2024 NDC: 5956-3875-64  Bacteriostatic 0.9% Sodium Chloride- 4 mL  Lot: [id number] Expiration: 1101/2025 NDC: 8841-6606-30  Dx: G43.709 S/P OR B/B Witnessed by Alveria Apley CMA  Botox consent signed

## 2022-07-28 MED ORDER — ONABOTULINUMTOXINA 200 UNITS IJ SOLR
155.0000 [IU] | Freq: Once | INTRAMUSCULAR | Status: AC
Start: 2022-07-28 — End: 2022-07-24
  Administered 2022-07-24: 155 [IU] via INTRAMUSCULAR

## 2022-07-28 NOTE — Addendum Note (Signed)
Addended by: Bertram Savin on: 07/28/2022 08:02 AM   Modules accepted: Orders

## 2022-09-15 ENCOUNTER — Other Ambulatory Visit (HOSPITAL_COMMUNITY): Payer: Self-pay

## 2022-10-16 ENCOUNTER — Ambulatory Visit: Payer: BC Managed Care – PPO | Admitting: Neurology

## 2022-10-16 DIAGNOSIS — G43709 Chronic migraine without aura, not intractable, without status migrainosus: Secondary | ICD-10-CM

## 2022-10-16 DIAGNOSIS — G43009 Migraine without aura, not intractable, without status migrainosus: Secondary | ICD-10-CM

## 2022-10-16 MED ORDER — ONABOTULINUMTOXINA 100 UNITS IJ SOLR
155.0000 [IU] | Freq: Once | INTRAMUSCULAR | Status: DC
Start: 2022-10-16 — End: 2023-10-20

## 2022-10-16 MED ORDER — ONABOTULINUMTOXINA 100 UNITS IJ SOLR
155.0000 [IU] | Freq: Once | INTRAMUSCULAR | Status: AC
Start: 2022-10-16 — End: ?
  Administered 2022-10-16: 155 [IU] via INTRAMUSCULAR

## 2022-10-16 NOTE — Progress Notes (Signed)
Consent Form Botulism Toxin Injection For Chronic Migraine  10/16/2022: stable 07/24/2022: stable, doing great 04/22/2022: stable 01/21/2022: Excellent. Baseline was 16-24 migraine days a month and daily headaches. Now 4 migraine days a month. <10 total headache days a month. failed Topamax (diplopia),triptans contraindicated, Propranolol, Nortriptyline, Ubrelvy, Ibuprofen, Ajovy, try nurtec prn acutely  Meds ordered this encounter  Medications   botulinum toxin Type A (BOTOX) injection 155 Units   botulinum toxin Type A (BOTOX) injection 155 Units    Botox- 100 units x 2 vial Lot: [id number] Expiration: 12/2024 NDC: 8295-6213-08   Bacteriostatic 0.9% Sodium Chloride- 4mL total [id number] Expiration: 04/07/2023 NDC: 9528-4132-44   Dx: G43.709 BB Witnessed:Diamond,CMA    10/29/2021: stable 07/23/2021: stable. Doing great 04/30/2021: stable 01/28/2021: >90% decrease in migraine frequency.    Consent Form Botulism Toxin Injection For Chronic Migraine    Reviewed orally with patient, additionally signature is on file:  Botulism toxin has been approved by the Federal drug administration for treatment of chronic migraine. Botulism toxin does not cure chronic migraine and it may not be effective in some patients.  The administration of botulism toxin is accomplished by injecting a small amount of toxin into the muscles of the neck and head. Dosage must be titrated for each individual. Any benefits resulting from botulism toxin tend to wear off after 3 months with a repeat injection required if benefit is to be maintained. Injections are usually done every 3-4 months with maximum effect peak achieved by about 2 or 3 weeks. Botulism toxin is expensive and you should be sure of what costs you will incur resulting from the injection.  The side effects of botulism toxin use for chronic migraine may include:   -Transient, and usually mild, facial weakness with facial  injections  -Transient, and usually mild, head or neck weakness with head/neck injections  -Reduction or loss of forehead facial animation due to forehead muscle weakness  -Eyelid drooping  -Dry eye  -Pain at the site of injection or bruising at the site of injection  -Double vision  -Potential unknown long term risks  Contraindications: You should not have Botox if you are pregnant, nursing, allergic to albumin, have an infection, skin condition, or muscle weakness at the site of the injection, or have myasthenia gravis, Lambert-Eaton syndrome, or ALS.  It is also possible that as with any injection, there may be an allergic reaction or no effect from the medication. Reduced effectiveness after repeated injections is sometimes seen and rarely infection at the injection site may occur. All care will be taken to prevent these side effects. If therapy is given over a long time, atrophy and wasting in the muscle injected may occur. Occasionally the patient's become refractory to treatment because they develop antibodies to the toxin. In this event, therapy needs to be modified.  I have read the above information and consent to the administration of botulism toxin.    BOTOX PROCEDURE NOTE FOR MIGRAINE HEADACHE    Contraindications and precautions discussed with patient(above). Aseptic procedure was observed and patient tolerated procedure. Procedure performed by Dr. Artemio Aly  The condition has existed for more than 6 months, and pt does not have a diagnosis of ALS, Myasthenia Gravis or Lambert-Eaton Syndrome.  Risks and benefits of injections discussed and pt agrees to proceed with the procedure.  Written consent obtained  These injections are medically necessary. Pt  receives good benefits from these injections. These injections do not cause sedations or hallucinations which the oral therapies  may cause.  Description of procedure:  The patient was placed in a sitting position. The  standard protocol was used for Botox as follows, with 5 units of Botox injected at each site:   -Procerus muscle, midline injection  -Corrugator muscle, bilateral injection  -Frontalis muscle, bilateral injection, with 2 sites each side, medial injection was performed in the upper one third of the frontalis muscle, in the region vertical from the medial inferior edge of the superior orbital rim. The lateral injection was again in the upper one third of the forehead vertically above the lateral limbus of the cornea, 1.5 cm lateral to the medial injection site.  -Temporalis muscle injection, 4 sites, bilaterally. The first injection was 3 cm above the tragus of the ear, second injection site was 1.5 cm to 3 cm up from the first injection site in line with the tragus of the ear. The third injection site was 1.5-3 cm forward between the first 2 injection sites. The fourth injection site was 1.5 cm posterior to the second injection site.   -Occipitalis muscle injection, 3 sites, bilaterally. The first injection was done one half way between the occipital protuberance and the tip of the mastoid process behind the ear. The second injection site was done lateral and superior to the first, 1 fingerbreadth from the first injection. The third injection site was 1 fingerbreadth superiorly and medially from the first injection site.  -Cervical paraspinal muscle injection, 2 sites, bilateral knee first injection site was 1 cm from the midline of the cervical spine, 3 cm inferior to the lower border of the occipital protuberance. The second injection site was 1.5 cm superiorly and laterally to the first injection site.  -Trapezius muscle injection was performed at 3 sites, bilaterally. The first injection site was in the upper trapezius muscle halfway between the inflection point of the neck, and the acromion. The second injection site was one half way between the acromion and the first injection site. The third  injection was done between the first injection site and the inflection point of the neck.   Will return for repeat injection in 3 months.   155 units of Botox was used, an45uy Botox not injected was wasted. The patient tolerated the procedure well, there were no complications of the above procedure.

## 2022-10-16 NOTE — Progress Notes (Signed)
Botox- 100 units x 2 vial Lot: [id number] Expiration: 12/2024 NDC: 6644-0347-42  Bacteriostatic 0.9% Sodium Chloride- 4mL total [id number] Expiration: 04/07/2023 NDC: 4332-9518-84  Dx: G43.709 BB  Witnessed by: Alveria Apley

## 2022-12-22 ENCOUNTER — Telehealth: Payer: Self-pay | Admitting: Neurology

## 2022-12-22 DIAGNOSIS — G43709 Chronic migraine without aura, not intractable, without status migrainosus: Secondary | ICD-10-CM

## 2022-12-22 NOTE — Telephone Encounter (Signed)
Faxed along with OV notes to [phone].

## 2022-12-22 NOTE — Telephone Encounter (Signed)
Completed [employer] continuation form and placed in nurse pod for MD signature.

## 2022-12-29 NOTE — Telephone Encounter (Signed)
Received approval, pt will continue to be buy/bill.  Auth#: [id number] (12/23/22-11/24/23)

## 2023-01-20 ENCOUNTER — Ambulatory Visit: Payer: BC Managed Care – PPO | Admitting: Neurology

## 2023-01-20 DIAGNOSIS — G43709 Chronic migraine without aura, not intractable, without status migrainosus: Secondary | ICD-10-CM

## 2023-01-20 MED ORDER — ONABOTULINUMTOXINA 200 UNITS IJ SOLR
155.0000 [IU] | Freq: Once | INTRAMUSCULAR | Status: AC
Start: 2023-01-20 — End: 2023-01-20
  Administered 2023-01-20: 155 [IU] via INTRAMUSCULAR

## 2023-01-20 NOTE — Progress Notes (Signed)
 Botox consent signed  Botox- 200 units x 1 vial Lot: [id number] Expiration: 03/2025 NDC: 1478-2956-21  Bacteriostatic 0.9% Sodium Chloride- 4 mL  Lot: [id number] Expiration: 04/07/2023 NDC: 8469-6295-28  Dx: U13.244 B/B Witnessed by Truitt Leep RN

## 2023-01-20 NOTE — Progress Notes (Signed)
 Consent Form Botulism Toxin Injection For Chronic Migraine  01/20/2023: Baseline was 16-24 migraine days a month and daily headaches. Now 4 migraine days a month. <10 total headache days a month. Patient feels that her migraines cause her jaw to ache in her jaw aching also can cause her migraines to worsen it is a trigger for migraines included 10 units in each masseter to see if that helps with migraine severity.  10/16/2022: stable 07/24/2022: stable, doing great 04/22/2022: stable 01/21/2022: Excellent. Baseline was 16-24 migraine days a month and daily headaches. Now 4 migraine days a month. <10 total headache days a month. failed Topamax (diplopia),triptans contraindicated, Propranolol , Nortriptyline , Ubrelvy , Ibuprofen , Ajovy , try nurtec prn acutely  Meds ordered this encounter  Medications   botulinum toxin Type A  (BOTOX ) injection 155 Units    Botox - 200 units x 1 vial Lot: [id number] Expiration: 03/2025 NDC: 9976-6078-97  Dx: H56.290 B/B Witnessed by Nena GRADE RN    10/29/2021: stable 07/23/2021: stable. Doing great 04/30/2021: stable 01/28/2021: >90% decrease in migraine frequency.    Consent Form Botulism Toxin Injection For Chronic Migraine    Reviewed orally with patient, additionally signature is on file:  Botulism toxin has been approved by the Federal drug administration for treatment of chronic migraine. Botulism toxin does not cure chronic migraine and it may not be effective in some patients.  The administration of botulism toxin is accomplished by injecting a small amount of toxin into the muscles of the neck and head. Dosage must be titrated for each individual. Any benefits resulting from botulism toxin tend to wear off after 3 months with a repeat injection required if benefit is to be maintained. Injections are usually done every 3-4 months with maximum effect peak achieved by about 2 or 3 weeks. Botulism toxin is expensive and you should be sure of what costs  you will incur resulting from the injection.  The side effects of botulism toxin use for chronic migraine may include:   -Transient, and usually mild, facial weakness with facial injections  -Transient, and usually mild, head or neck weakness with head/neck injections  -Reduction or loss of forehead facial animation due to forehead muscle weakness  -Eyelid drooping  -Dry eye  -Pain at the site of injection or bruising at the site of injection  -Double vision  -Potential unknown long term risks  Contraindications: You should not have Botox  if you are pregnant, nursing, allergic to albumin, have an infection, skin condition, or muscle weakness at the site of the injection, or have myasthenia gravis, Lambert-Eaton syndrome, or ALS.  It is also possible that as with any injection, there may be an allergic reaction or no effect from the medication. Reduced effectiveness after repeated injections is sometimes seen and rarely infection at the injection site may occur. All care will be taken to prevent these side effects. If therapy is given over a long time, atrophy and wasting in the muscle injected may occur. Occasionally the patient's become refractory to treatment because they develop antibodies to the toxin. In this event, therapy needs to be modified.  I have read the above information and consent to the administration of botulism toxin.    BOTOX  PROCEDURE NOTE FOR MIGRAINE HEADACHE    Contraindications and precautions discussed with patient(above). Aseptic procedure was observed and patient tolerated procedure. Procedure performed by Dr. Andree Epp  The condition has existed for more than 6 months, and pt does not have a diagnosis of ALS, Myasthenia Gravis or Lambert-Eaton Syndrome.  Risks and benefits of injections discussed and pt agrees to proceed with the procedure.  Written consent obtained  These injections are medically necessary. Pt  receives good benefits from these injections.  These injections do not cause sedations or hallucinations which the oral therapies may cause.  Description of procedure:  The patient was placed in a sitting position. The standard protocol was used for Botox  as follows, with 5 units of Botox  injected at each site:   -Procerus muscle, midline injection  -Corrugator muscle, bilateral injection  -Frontalis muscle, bilateral injection, with 2 sites each side, medial injection was performed in the upper one third of the frontalis muscle, in the region vertical from the medial inferior edge of the superior orbital rim. The lateral injection was again in the upper one third of the forehead vertically above the lateral limbus of the cornea, 1.5 cm lateral to the medial injection site.  -Temporalis muscle injection, 4 sites, bilaterally. The first injection was 3 cm above the tragus of the ear, second injection site was 1.5 cm to 3 cm up from the first injection site in line with the tragus of the ear. The third injection site was 1.5-3 cm forward between the first 2 injection sites. The fourth injection site was 1.5 cm posterior to the second injection site.   -Occipitalis muscle injection, 3 sites, bilaterally. The first injection was done one half way between the occipital protuberance and the tip of the mastoid process behind the ear. The second injection site was done lateral and superior to the first, 1 fingerbreadth from the first injection. The third injection site was 1 fingerbreadth superiorly and medially from the first injection site.  -Cervical paraspinal muscle injection, 2 sites, bilateral knee first injection site was 1 cm from the midline of the cervical spine, 3 cm inferior to the lower border of the occipital protuberance. The second injection site was 1.5 cm superiorly and laterally to the first injection site.  -Trapezius muscle injection was performed at 3 sites, bilaterally. The first injection site was in the upper trapezius muscle  halfway between the inflection point of the neck, and the acromion. The second injection site was one half way between the acromion and the first injection site. The third injection was done between the first injection site and the inflection point of the neck.   Will return for repeat injection in 3 months.   155 units of Botox  was used, an45uy Botox  not injected was wasted. The patient tolerated the procedure well, there were no complications of the above procedure.

## 2023-01-22 ENCOUNTER — Other Ambulatory Visit: Payer: Self-pay | Admitting: Neurology

## 2023-01-22 DIAGNOSIS — G43001 Migraine without aura, not intractable, with status migrainosus: Secondary | ICD-10-CM

## 2023-01-22 DIAGNOSIS — R112 Nausea with vomiting, unspecified: Secondary | ICD-10-CM

## 2023-01-27 NOTE — Telephone Encounter (Signed)
Pt checking status of this refill. Requesting call back

## 2023-01-27 NOTE — Telephone Encounter (Signed)
Called pt and let her know refill sent. Apologized for her wait.

## 2023-04-14 ENCOUNTER — Ambulatory Visit: Payer: BC Managed Care – PPO | Admitting: Neurology

## 2023-04-14 DIAGNOSIS — G43709 Chronic migraine without aura, not intractable, without status migrainosus: Secondary | ICD-10-CM

## 2023-04-14 MED ORDER — ONABOTULINUMTOXINA 200 UNITS IJ SOLR
155.0000 [IU] | Freq: Once | INTRAMUSCULAR | Status: AC
Start: 2023-04-14 — End: 2023-04-14
  Administered 2023-04-14: 155 [IU] via INTRAMUSCULAR

## 2023-04-14 NOTE — Progress Notes (Signed)
 Consent Form Botulism Toxin Injection For Chronic Migraine  04/14/2023: stable doing well  01/20/2023: Baseline was 16-24 migraine days a month and daily headaches. Now 4 migraine days a month. <10 total headache days a month. Patient feels that her migraines cause her jaw to ache in her jaw aching also can cause her migraines to worsen it is a trigger for migraines included 10 units in each masseter to see if that helps with migraine severity.  10/16/2022: stable 07/24/2022: stable, doing great 04/22/2022: stable 01/21/2022: Excellent. Baseline was 16-24 migraine days a month and daily headaches. Now 4 migraine days a month. <10 total headache days a month. failed Topamax (diplopia),triptans contraindicated, Propranolol, Nortriptyline, Ubrelvy, Ibuprofen, Ajovy, try nurtec prn acutely  Meds ordered this encounter  Medications   botulinum toxin Type A (BOTOX) injection 155 Units    Botox- 200 units x 1 vial Lot: [id number] Expiration: 04/2025 NDC: 4540-9811-91   Dx: Y78.295 B/B Witnessed by Janalyn Harder CMA    10/29/2021: stable 07/23/2021: stable. Doing great 04/30/2021: stable 01/28/2021: >90% decrease in migraine frequency.    Consent Form Botulism Toxin Injection For Chronic Migraine    Reviewed orally with patient, additionally signature is on file:  Botulism toxin has been approved by the Federal drug administration for treatment of chronic migraine. Botulism toxin does not cure chronic migraine and it may not be effective in some patients.  The administration of botulism toxin is accomplished by injecting a small amount of toxin into the muscles of the neck and head. Dosage must be titrated for each individual. Any benefits resulting from botulism toxin tend to wear off after 3 months with a repeat injection required if benefit is to be maintained. Injections are usually done every 3-4 months with maximum effect peak achieved by about 2 or 3 weeks. Botulism toxin is expensive  and you should be sure of what costs you will incur resulting from the injection.  The side effects of botulism toxin use for chronic migraine may include:   -Transient, and usually mild, facial weakness with facial injections  -Transient, and usually mild, head or neck weakness with head/neck injections  -Reduction or loss of forehead facial animation due to forehead muscle weakness  -Eyelid drooping  -Dry eye  -Pain at the site of injection or bruising at the site of injection  -Double vision  -Potential unknown long term risks  Contraindications: You should not have Botox if you are pregnant, nursing, allergic to albumin, have an infection, skin condition, or muscle weakness at the site of the injection, or have myasthenia gravis, Lambert-Eaton syndrome, or ALS.  It is also possible that as with any injection, there may be an allergic reaction or no effect from the medication. Reduced effectiveness after repeated injections is sometimes seen and rarely infection at the injection site may occur. All care will be taken to prevent these side effects. If therapy is given over a long time, atrophy and wasting in the muscle injected may occur. Occasionally the patient's become refractory to treatment because they develop antibodies to the toxin. In this event, therapy needs to be modified.  I have read the above information and consent to the administration of botulism toxin.    BOTOX PROCEDURE NOTE FOR MIGRAINE HEADACHE    Contraindications and precautions discussed with patient(above). Aseptic procedure was observed and patient tolerated procedure. Procedure performed by Dr. Artemio Aly  The condition has existed for more than 6 months, and pt does not have a diagnosis of  ALS, Myasthenia Gravis or Lambert-Eaton Syndrome.  Risks and benefits of injections discussed and pt agrees to proceed with the procedure.  Written consent obtained  These injections are medically necessary. Pt  receives  good benefits from these injections. These injections do not cause sedations or hallucinations which the oral therapies may cause.  Description of procedure:  The patient was placed in a sitting position. The standard protocol was used for Botox as follows, with 5 units of Botox injected at each site:   -Procerus muscle, midline injection  -Corrugator muscle, bilateral injection  -Frontalis muscle, bilateral injection, with 2 sites each side, medial injection was performed in the upper one third of the frontalis muscle, in the region vertical from the medial inferior edge of the superior orbital rim. The lateral injection was again in the upper one third of the forehead vertically above the lateral limbus of the cornea, 1.5 cm lateral to the medial injection site.  -Temporalis muscle injection, 4 sites, bilaterally. The first injection was 3 cm above the tragus of the ear, second injection site was 1.5 cm to 3 cm up from the first injection site in line with the tragus of the ear. The third injection site was 1.5-3 cm forward between the first 2 injection sites. The fourth injection site was 1.5 cm posterior to the second injection site.   -Occipitalis muscle injection, 3 sites, bilaterally. The first injection was done one half way between the occipital protuberance and the tip of the mastoid process behind the ear. The second injection site was done lateral and superior to the first, 1 fingerbreadth from the first injection. The third injection site was 1 fingerbreadth superiorly and medially from the first injection site.  -Cervical paraspinal muscle injection, 2 sites, bilateral knee first injection site was 1 cm from the midline of the cervical spine, 3 cm inferior to the lower border of the occipital protuberance. The second injection site was 1.5 cm superiorly and laterally to the first injection site.  -Trapezius muscle injection was performed at 3 sites, bilaterally. The first injection  site was in the upper trapezius muscle halfway between the inflection point of the neck, and the acromion. The second injection site was one half way between the acromion and the first injection site. The third injection was done between the first injection site and the inflection point of the neck.   Will return for repeat injection in 3 months.   155 units of Botox was used, 45u Botox not injected was wasted. The patient tolerated the procedure well, there were no complications of the above procedure.

## 2023-04-14 NOTE — Progress Notes (Signed)
 Botox consent signed  Botox- 200 units x 1 vial Lot: [id number] Expiration: 04/2025 NDC: 6213-0865-78  Bacteriostatic 0.9% Sodium Chloride- 4 mL  Lot: [id number] Expiration: 11/07/23 NDC: 5284-1324-40  Dx: N02.725 B/B Witnessed by Janalyn Harder CMA

## 2023-04-23 ENCOUNTER — Other Ambulatory Visit: Payer: Self-pay | Admitting: Neurology

## 2023-04-23 DIAGNOSIS — G43009 Migraine without aura, not intractable, without status migrainosus: Secondary | ICD-10-CM

## 2023-05-25 ENCOUNTER — Telehealth: Payer: Self-pay | Admitting: Pharmacist

## 2023-05-25 ENCOUNTER — Other Ambulatory Visit (HOSPITAL_COMMUNITY): Payer: Self-pay

## 2023-05-25 NOTE — Telephone Encounter (Signed)
 Pharmacy Patient Advocate Encounter   Received notification from CoverMyMeds that prior authorization for Nurtec 75MG  dispersible tablets is required/requested.   Insurance verification completed.   The patient is insured through [facility] .   Per test claim: The current 30 day co-pay is, $0.  No PA needed at this time. This test claim was processed through [facility]- copay amounts may vary at other pharmacies due to pharmacy/plan contracts, or as the patient moves through the different stages of their insurance plan.

## 2023-06-11 ENCOUNTER — Other Ambulatory Visit (HOSPITAL_COMMUNITY): Payer: Self-pay

## 2023-06-11 ENCOUNTER — Telehealth: Payer: Self-pay

## 2023-06-11 NOTE — Telephone Encounter (Signed)
 Pharmacy Patient Advocate Encounter   Received notification from CoverMyMeds that prior authorization for Nurtec 75MG  dispersible tablets is required/requested.   Insurance verification completed.   The patient is insured through [facility] .   Per test claim: PA required; PA submitted to above mentioned insurance via CoverMyMeds Key/confirmation #/EOC [facility] Status is pending

## 2023-06-15 ENCOUNTER — Other Ambulatory Visit (HOSPITAL_COMMUNITY): Payer: Self-pay

## 2023-06-15 NOTE — Telephone Encounter (Signed)
 Pharmacy Patient Advocate Encounter  Received notification from [facility] that Prior Authorization for Nurtec 75MG  dispersible tablets has been APPROVED from 06/11/2023 to 06/10/2024. Ran test claim, Copay is $0. This test claim was processed through [facility]- copay amounts may vary at other pharmacies due to pharmacy/plan contracts, or as the patient moves through the different stages of their insurance plan.   PA #/Case ID/Reference #: PA Case ID #: [id number]

## 2023-07-07 ENCOUNTER — Ambulatory Visit: Admitting: Neurology

## 2023-07-07 VITALS — BP 115/72 | HR 83

## 2023-07-07 DIAGNOSIS — G43709 Chronic migraine without aura, not intractable, without status migrainosus: Secondary | ICD-10-CM | POA: Diagnosis not present

## 2023-07-07 MED ORDER — ONABOTULINUMTOXINA 200 UNITS IJ SOLR
155.0000 [IU] | Freq: Once | INTRAMUSCULAR | Status: AC
Start: 2023-07-07 — End: 2023-07-07
  Administered 2023-07-07: 155 [IU] via INTRAMUSCULAR

## 2023-07-07 NOTE — Progress Notes (Signed)
 Consent Form Botulism Toxin Injection For Chronic Migraine   07/07/2023: >> 50% improvement in migraine and headache fre and severity doing great. Now (after botox ) has 4 migraine days a month and < 6 total headache days a month. 04/14/2023: stable doing well  01/20/2023: Baseline was 16-24 migraine days a month and daily headaches. Now 4 migraine days a month. <10 total headache days a month. Patient feels that her migraines cause her jaw to ache in her jaw aching also can cause her migraines to worsen it is a trigger for migraines included 10 units in each masseter to see if that helps with migraine severity.  10/16/2022: stable 07/24/2022: stable, doing great 04/22/2022: stable 01/21/2022: Excellent. Baseline was 16-24 migraine days a month and daily headaches. Now 4 migraine days a month. <10 total headache days a month. failed Topamax (diplopia),triptans contraindicated, Propranolol , Nortriptyline , Ubrelvy , Ibuprofen , Ajovy , try nurtec prn acutely  Meds ordered this encounter  Medications   botulinum toxin Type A  (BOTOX ) injection 155 Units    Botox - 200 units x 1 vial Lot: [id number] Expiration: 02/2025 NDC: 9976-6078-97  Dx: H56.290 B/B Witnessed by Nena GRADE RN    10/29/2021: stable 07/23/2021: stable. Doing great 04/30/2021: stable 01/28/2021: >90% decrease in migraine frequency.    Consent Form Botulism Toxin Injection For Chronic Migraine    Reviewed orally with patient, additionally signature is on file:  Botulism toxin has been approved by the Federal drug administration for treatment of chronic migraine. Botulism toxin does not cure chronic migraine and it may not be effective in some patients.  The administration of botulism toxin is accomplished by injecting a small amount of toxin into the muscles of the neck and head. Dosage must be titrated for each individual. Any benefits resulting from botulism toxin tend to wear off after 3 months with a repeat injection  required if benefit is to be maintained. Injections are usually done every 3-4 months with maximum effect peak achieved by about 2 or 3 weeks. Botulism toxin is expensive and you should be sure of what costs you will incur resulting from the injection.  The side effects of botulism toxin use for chronic migraine may include:   -Transient, and usually mild, facial weakness with facial injections  -Transient, and usually mild, head or neck weakness with head/neck injections  -Reduction or loss of forehead facial animation due to forehead muscle weakness  -Eyelid drooping  -Dry eye  -Pain at the site of injection or bruising at the site of injection  -Double vision  -Potential unknown long term risks  Contraindications: You should not have Botox  if you are pregnant, nursing, allergic to albumin, have an infection, skin condition, or muscle weakness at the site of the injection, or have myasthenia gravis, Lambert-Eaton syndrome, or ALS.  It is also possible that as with any injection, there may be an allergic reaction or no effect from the medication. Reduced effectiveness after repeated injections is sometimes seen and rarely infection at the injection site may occur. All care will be taken to prevent these side effects. If therapy is given over a long time, atrophy and wasting in the muscle injected may occur. Occasionally the patient's become refractory to treatment because they develop antibodies to the toxin. In this event, therapy needs to be modified.  I have read the above information and consent to the administration of botulism toxin.    BOTOX  PROCEDURE NOTE FOR MIGRAINE HEADACHE    Contraindications and precautions discussed with patient(above). Aseptic procedure  was observed and patient tolerated procedure. Procedure performed by Dr. Andree Epp  The condition has existed for more than 6 months, and pt does not have a diagnosis of ALS, Myasthenia Gravis or Lambert-Eaton Syndrome.   Risks and benefits of injections discussed and pt agrees to proceed with the procedure.  Written consent obtained  These injections are medically necessary. Pt  receives good benefits from these injections. These injections do not cause sedations or hallucinations which the oral therapies may cause.  Description of procedure:  The patient was placed in a sitting position. The standard protocol was used for Botox  as follows, with 5 units of Botox  injected at each site:   -Procerus muscle, midline injection  -Corrugator muscle, bilateral injection  -Frontalis muscle, bilateral injection, with 2 sites each side, medial injection was performed in the upper one third of the frontalis muscle, in the region vertical from the medial inferior edge of the superior orbital rim. The lateral injection was again in the upper one third of the forehead vertically above the lateral limbus of the cornea, 1.5 cm lateral to the medial injection site.  -Temporalis muscle injection, 4 sites, bilaterally. The first injection was 3 cm above the tragus of the ear, second injection site was 1.5 cm to 3 cm up from the first injection site in line with the tragus of the ear. The third injection site was 1.5-3 cm forward between the first 2 injection sites. The fourth injection site was 1.5 cm posterior to the second injection site.   -Occipitalis muscle injection, 3 sites, bilaterally. The first injection was done one half way between the occipital protuberance and the tip of the mastoid process behind the ear. The second injection site was done lateral and superior to the first, 1 fingerbreadth from the first injection. The third injection site was 1 fingerbreadth superiorly and medially from the first injection site.  -Cervical paraspinal muscle injection, 2 sites, bilateral knee first injection site was 1 cm from the midline of the cervical spine, 3 cm inferior to the lower border of the occipital protuberance. The second  injection site was 1.5 cm superiorly and laterally to the first injection site.  -Trapezius muscle injection was performed at 3 sites, bilaterally. The first injection site was in the upper trapezius muscle halfway between the inflection point of the neck, and the acromion. The second injection site was one half way between the acromion and the first injection site. The third injection was done between the first injection site and the inflection point of the neck.   Will return for repeat injection in 3 months.   155 units of Botox  was used, 45u Botox  not injected was wasted. The patient tolerated the procedure well, there were no complications of the above procedure.

## 2023-07-07 NOTE — Progress Notes (Signed)
 Botox - 200 units x 1 vial Lot: [id number] Expiration: 02/2025 NDC: 9976-6078-97  Bacteriostatic 0.9% Sodium Chloride - 4 mL  Lot: [id number] Expiration: 11/05/2024 NDC: 9590-8033-97  Dx: H56.290 B/B Witnessed by Nena GRADE RN

## 2023-07-13 NOTE — Telephone Encounter (Signed)
 Received approval for pt to go back to filling through [facility], please send rx over.  Auth#: [id number] (07/09/23-06/09/24)

## 2023-09-16 MED ORDER — BOTOX 200 UNITS IJ SOLR: INTRAMUSCULAR | 3 refills | Status: AC

## 2023-09-16 NOTE — Addendum Note (Signed)
 Addended by: HILLIARD HEATHER CROME on: 09/16/2023 09:25 AM   Modules accepted: Orders

## 2023-09-16 NOTE — Telephone Encounter (Signed)
 Botox  200 unit refills sent to [facility] [place].

## 2023-09-29 ENCOUNTER — Ambulatory Visit: Admitting: Neurology

## 2023-10-07 ENCOUNTER — Telehealth: Payer: Self-pay | Admitting: Adult Health

## 2023-10-07 NOTE — Telephone Encounter (Signed)
 Called pt and rescheduled Botox  appt that was originally with Dr. Ines on 9/23. Pt states she is okay with seeing Duwaine Russell again for her Botox . She is aware that Duwaine will not inject her masseters, appt rescheduled for 10/14 @ 9:30 am.

## 2023-10-12 NOTE — Telephone Encounter (Signed)
 I left her a voice mail that she needs to call [facility] to set up her Botox  delivery.

## 2023-10-20 ENCOUNTER — Ambulatory Visit: Admitting: Adult Health

## 2023-10-20 VITALS — BP 120/79 | HR 86

## 2023-10-20 DIAGNOSIS — G43709 Chronic migraine without aura, not intractable, without status migrainosus: Secondary | ICD-10-CM

## 2023-10-20 MED ORDER — ONABOTULINUMTOXINA 200 UNITS IJ SOLR
155.0000 [IU] | Freq: Once | INTRAMUSCULAR | Status: AC
  Administered 2023-10-20: 155 [IU] via INTRAMUSCULAR

## 2023-10-20 NOTE — Progress Notes (Signed)
 Botox - 200 units x 1 vial Lot: [id number] Expiration: 02/2026 NDC: 9976-6078-97  Bacteriostatic 0.9% Sodium Chloride - 4 mL  Lot: [id number] Expiration: 10/2024 NDC: 9590-8033-97  Dx: H56.290 S/P  Witnessed byBethany,RN

## 2023-10-20 NOTE — Progress Notes (Signed)
 10/20/23: First Botox  injection with me.  Reports that she is 3 weeks late for Botox  fortunately she has not had any migraines but reports that she has had some mild headaches that would resolve with Tylenol .  Typically during the 12-week cycle she does not have any headaches at all.  Uses Nurtec for abortive therapy  History from Dr. Ines: 07/07/2023 : >> 50% improvement in migraine and headache fre and severity doing great. Now (after botox ) has 4 migraine days a month and < 6 total headache days a month. 04/14/2023: stable doing well   01/20/2023: Baseline was 16-24 migraine days a month and daily headaches. Now 4 migraine days a month. <10 total headache days a month. Patient feels that her migraines cause her jaw to ache in her jaw aching also can cause her migraines to worsen it is a trigger for migraines included 10 units in each masseter to see if that helps with migraine severity.   10/16/2022: stable 07/24/2022: stable, doing great 04/22/2022: stable 01/21/2022: Excellent. Baseline was 16-24 migraine days a month and daily headaches. Now 4 migraine days a month. <10 total headache days a month. failed Topamax (diplopia),triptans contraindicated, Propranolol , Nortriptyline , Ubrelvy , Ibuprofen , Ajovy , try nurtec prn acutely     BOTOX  PROCEDURE NOTE FOR MIGRAINE HEADACHE    Contraindications and precautions discussed with patient(above). Aseptic procedure was observed and patient tolerated procedure. Procedure performed by Duwaine Russell, NP  The condition has existed for more than 6 months, and pt does not have a diagnosis of ALS, Myasthenia Gravis or Lambert-Eaton Syndrome.  Risks and benefits of injections discussed and pt agrees to proceed with the procedure.  Written consent obtained  These injections do not cause sedations or hallucinations which the oral therapies may cause.  Indication/Diagnosis: chronic migraine BOTOX (G9414) injection was performed according to protocol by  [employer]er[employer]. 200 units of BOTOX  was dissolved into 4 cc NS.   NDC: [address]-98  Type of toxin: Botox   Botox - 200 units x 1 vial Lot: [id number] Expiration: 02/2026 NDC: 9976-6078-97   Bacteriostatic 0.9% Sodium Chloride - 4 mL  Lot: [id number] Expiration: 10/2024 NDC: 9590-8033-97   Dx: H56.290   Description of procedure:  The patient was placed in a sitting position. The standard protocol was used for Botox  as follows, with 5 units of Botox  injected at each site:   -Procerus muscle, midline injection  -Corrugator muscle, bilateral injection  -Frontalis muscle, bilateral injection, with 2 sites each side, medial injection was performed in the upper one third of the frontalis muscle, in the region vertical from the medial inferior edge of the superior orbital rim. The lateral injection was again in the upper one third of the forehead vertically above the lateral limbus of the cornea, 1.5 cm lateral to the medial injection site.  -Temporalis muscle injection, 4 sites, bilaterally. The first injection was 3 cm above the tragus of the ear, second injection site was 1.5 cm to 3 cm up from the first injection site in line with the tragus of the ear. The third injection site was 1.5-3 cm forward between the first 2 injection sites. The fourth injection site was 1.5 cm posterior to the second injection site.  -Occipitalis muscle injection, 3 sites, bilaterally. The first injection was done one half way between the occipital protuberance and the tip of the mastoid process behind the ear. The second injection site was done lateral and superior to the first, 1 fingerbreadth from the first injection. The third injection site was 1 fingerbreadth  superiorly and medially from the first injection site.  -Cervical paraspinal muscle injection, 2 sites, bilateral knee first injection site was 1 cm from the midline of the cervical spine, 3 cm inferior to the lower border of the occipital protuberance. The second  injection site was 1.5 cm superiorly and laterally to the first injection site.  -Trapezius muscle injection was performed at 3 sites, bilaterally. The first injection site was in the upper trapezius muscle halfway between the inflection point of the neck, and the acromion. The second injection site was one half way between the acromion and the first injection site. The third injection was done between the first injection site and the inflection point of the neck.   Will return for repeat injection in 3 months.   A 200 unit sof Botox  was used, 155 units were injected, the rest of the Botox  was wasted. The patient tolerated the procedure well, there were no complications of the above procedure.  Duwaine Russell, MSN, NP-C 10/20/2023, 9:32 AM [facility] [address], [address] [place], [place] [address] [phone]

## 2024-01-12 ENCOUNTER — Encounter: Payer: Self-pay | Admitting: Adult Health

## 2024-01-12 ENCOUNTER — Ambulatory Visit: Admitting: Adult Health

## 2024-01-12 VITALS — BP 118/68 | HR 89

## 2024-01-12 DIAGNOSIS — G43709 Chronic migraine without aura, not intractable, without status migrainosus: Secondary | ICD-10-CM | POA: Diagnosis not present

## 2024-01-12 MED ORDER — ONABOTULINUMTOXINA 200 UNITS IJ SOLR
155.0000 [IU] | Freq: Once | INTRAMUSCULAR | Status: AC
  Administered 2024-01-12: 155 [IU] via INTRAMUSCULAR

## 2024-01-12 NOTE — Progress Notes (Signed)
 Botox - 200 units x 1 vial Lot: [id number] Expiration: 03/2026 NDC: 9976-6078-97   Bacteriostatic 0.9% Sodium Chloride - 4 mL  Lot: [id number] Expiration: 10/2024 NDC: 9590-8033-97   Dx: H56.290 SP   Witness: Diandra D

## 2024-01-12 NOTE — Progress Notes (Signed)
 "  01/12/24:2-3 migraines a month but has had more breakthrough mild headaches. She takes  tylenol  and zofran  that helps. Nurtec continues to be helpful  10/20/23: First Botox  injection with me.  Reports that she is 3 weeks late for Botox  fortunately she has not had any migraines but reports that she has had some mild headaches that would resolve with Tylenol .  Typically during the 12-week cycle she does not have any headaches at all.  Uses Nurtec for abortive therapy  History from Dr. Ines: 07/07/2023 : >> 50% improvement in migraine and headache fre and severity doing great. Now (after botox ) has 4 migraine days a month and < 6 total headache days a month. 04/14/2023: stable doing well   01/20/2023: Baseline was 16-24 migraine days a month and daily headaches. Now 4 migraine days a month. <10 total headache days a month. Patient feels that her migraines cause her jaw to ache in her jaw aching also can cause her migraines to worsen it is a trigger for migraines included 10 units in each masseter to see if that helps with migraine severity.   10/16/2022: stable 07/24/2022: stable, doing great 04/22/2022: stable 01/21/2022: Excellent. Baseline was 16-24 migraine days a month and daily headaches. Now 4 migraine days a month. <10 total headache days a month. failed Topamax (diplopia),triptans contraindicated, Propranolol , Nortriptyline , Ubrelvy , Ibuprofen , Ajovy , try nurtec prn acutely     BOTOX  PROCEDURE NOTE FOR MIGRAINE HEADACHE    Contraindications and precautions discussed with patient(above). Aseptic procedure was observed and patient tolerated procedure. Procedure performed by Duwaine Russell, NP  The condition has existed for more than 6 months, and pt does not have a diagnosis of ALS, Myasthenia Gravis or Lambert-Eaton Syndrome.  Risks and benefits of injections discussed and pt agrees to proceed with the procedure.  Written consent obtained  These injections do not cause sedations or  hallucinations which the oral therapies may cause.  Indication/Diagnosis: chronic migraine BOTOX (G9414) injection was performed according to protocol by [employer]. 200 units of BOTOX  was dissolved into 4 cc NS.   NDC: [address]-98  Type of toxin: Botox     Botox - 200 units x 1 vial Lot: [id number] Expiration: 03/2026 NDC: 9976-6078-97   Bacteriostatic 0.9% Sodium Chloride - 4 mL  Lot: [id number] Expiration: 10/2024 NDC: 9590-8033-97   Dx: H56.290       Description of procedure:  The patient was placed in a sitting position. The standard protocol was used for Botox  as follows, with 5 units of Botox  injected at each site:   -Procerus muscle, midline injection  -Corrugator muscle, bilateral injection  -Frontalis muscle, bilateral injection, with 2 sites each side, medial injection was performed in the upper one third of the frontalis muscle, in the region vertical from the medial inferior edge of the superior orbital rim. The lateral injection was again in the upper one third of the forehead vertically above the lateral limbus of the cornea, 1.5 cm lateral to the medial injection site.  -Temporalis muscle injection, 4 sites, bilaterally. The first injection was 3 cm above the tragus of the ear, second injection site was 1.5 cm to 3 cm up from the first injection site in line with the tragus of the ear. The third injection site was 1.5-3 cm forward between the first 2 injection sites. The fourth injection site was 1.5 cm posterior to the second injection site.  -Occipitalis muscle injection, 3 sites, bilaterally. The first injection was done one half way between the occipital protuberance and the tip of  the mastoid process behind the ear. The second injection site was done lateral and superior to the first, 1 fingerbreadth from the first injection. The third injection site was 1 fingerbreadth superiorly and medially from the first injection site.  -Cervical paraspinal muscle injection, 2  sites, bilateral knee first injection site was 1 cm from the midline of the cervical spine, 3 cm inferior to the lower border of the occipital protuberance. The second injection site was 1.5 cm superiorly and laterally to the first injection site.  -Trapezius muscle injection was performed at 3 sites, bilaterally. The first injection site was in the upper trapezius muscle halfway between the inflection point of the neck, and the acromion. The second injection site was one half way between the acromion and the first injection site. The third injection was done between the first injection site and the inflection point of the neck.   Will return for repeat injection in 3 months.   A 200 unit sof Botox  was used, 155 units were injected, the rest of the Botox  was wasted. The patient tolerated the procedure well, there were no complications of the above procedure.  Duwaine Russell, MSN, NP-C 01/12/2024, 10:22 AM [facility] [address] [place], [place] [address] [phone]  "

## 2024-01-26 ENCOUNTER — Ambulatory Visit: Admitting: Neurology

## 2024-01-26 ENCOUNTER — Encounter: Payer: Self-pay | Admitting: Neurology

## 2024-01-26 VITALS — BP 132/74 | HR 86 | Ht 66.0 in | Wt 188.5 lb

## 2024-01-26 DIAGNOSIS — G43009 Migraine without aura, not intractable, without status migrainosus: Secondary | ICD-10-CM

## 2024-01-26 DIAGNOSIS — G43709 Chronic migraine without aura, not intractable, without status migrainosus: Secondary | ICD-10-CM

## 2024-01-26 NOTE — Progress Notes (Signed)
 "  [facility]  PATIENT: Lindsey Fitzgerald DOB: [birth year]  REFERRING DOCTOR OR PCP:  Bonny Helm, FNP SOURCE: Patient, notes from neurology, imaging and lab reports  _________________________________   HISTORICAL  CHIEF COMPLAINT:  Chief Complaint  Patient presents with   Follow-up    Room 10 Alone MIGRAINE    HISTORY OF PRESENT ILLNESS:  Lindsey Fitzgerald is a 29 year old woman with a history of migraine headaches since age 58  Migraines started around age 13-8.   Both parents have migraine.   Migraines worsened in 2015 and she began to see Dr. Ines.  At that time migraines occurred daily.   She tried multiple prophylactic medications but they were not effective or caused s.e.  She has been on Botox  since mid 2016 with benefit.   She reports her migraines have done well with 2-3 in last month.   When these occur she takes a Nurtec with benefit most of the time.  If nausea she takes Zofran .  She tries to take as early as possible as if she waits it is less beneficial.    With a migraine, she gets nausea and photophobia.   She has had milder HA 1-2 times a week that she treats with an NSAID or Nurtec.    Last Botox  was 1-2 weeks ago with Duwaine Russell.     She has no other neurologic symptoms.      Migraine Med History: Botox  has been most effective reducing frequency from 30/30 days to 3/30.     Meds tried:  Topiramate, propranolol , NSAIDs,  nortriptyline  were not effective.    Qulipta  caused heartburn.   Ajovy  id not help as well as Botox   Acute therapy:  Nurtec is most effective.  Ubrelvy  also helps though Nurtec is better for her.  NSAIDs monotherapy not effective but they help in combination with the Nurtec.      Her father had anaphylaxis to a triptan so she has never tried.    REVIEW OF SYSTEMS: Constitutional: No fevers, chills, sweats, or change in appetite Eyes: No visual changes, double vision, eye pain Ear, nose and throat: No hearing loss, ear  pain, nasal congestion, sore throat Cardiovascular: No chest pain, palpitations Respiratory:  No shortness of breath at rest or with exertion.   No wheezes GastrointestinaI: No nausea, vomiting, diarrhea, abdominal pain, fecal incontinence Genitourinary:  No dysuria, urinary retention or frequency.  No nocturia. Musculoskeletal:  No neck pain, back pain Integumentary: No rash, pruritus, skin lesions Neurological: as above Psychiatric: No depression at this time.  No anxiety Endocrine: No palpitations, diaphoresis, change in appetite, change in weigh or increased thirst Hematologic/Lymphatic:  No anemia, purpura, petechiae. Allergic/Immunologic: No itchy/runny eyes, nasal congestion, recent allergic reactions, rashes  ALLERGIES: Allergies[1]  HOME MEDICATIONS: Current Medications[2]  PAST MEDICAL HISTORY: Past Medical History:  Diagnosis Date   Migraine    Premature baby    Tension pneumothorax     PAST SURGICAL HISTORY: Past Surgical History:  Procedure Laterality Date   nexplanon       insertion 04-13-14   None     TYMPANOSTOMY TUBE PLACEMENT     WISDOM TOOTH EXTRACTION      FAMILY HISTORY: Family History  Problem Relation Age of Onset   Diabetes Mother    Hyperlipidemia Mother    Hypertension Mother    Migraines Mother    Fibromyalgia Mother    Migraines Father    Other Father        Brain Tumor  Stroke Maternal Grandmother    Lupus Maternal Grandmother    Stroke Maternal Grandfather    Hypertension Maternal Grandfather    Celiac disease Sister    Diabetes Paternal Uncle     SOCIAL HISTORY: Social History   Socioeconomic History   Marital status: Single    Spouse name: Not on file   Number of children: 0   Years of education: 12   Highest education level: Not on file  Occupational History   Not on file  Tobacco Use   Smoking status: Never   Smokeless tobacco: Never  Vaping Use   Vaping status: Never Used  Substance and Sexual Activity   Alcohol  use: Yes    Comment: occ   Drug use: No   Sexual activity: Yes    Partners: Male    Birth control/protection: Implant    Comment: nexplanon   Other Topics Concern   Not on file  Social History Narrative   Patient is single with no children.   Patient is right handed.   Patient has a high school education and currently in college.   Patient drinks caffeine  occasionally.   Social Drivers of Health   Tobacco Use: Low Risk (01/26/2024)   Patient History    Smoking Tobacco Use: Never    Smokeless Tobacco Use: Never    Passive Exposure: Not on file  Financial Resource Strain: Not on file  Food Insecurity: Not on file  Transportation Needs: Not on file  Physical Activity: Not on file  Stress: Not on file  Social Connections: Not on file  Intimate Partner Violence: Not on file  Depression (EYV7-0): Not on file  Alcohol Screen: Not on file  Housing: Not on file  Utilities: Not on file  Health Literacy: Not on file       PHYSICAL EXAM  Vitals:   01/26/24 1355  BP: 132/74  Pulse: 86  SpO2: 99%  Weight: 188 lb 8 oz (85.5 kg)  Height: 5' 6 (1.676 m)    Body mass index is 30.42 kg/m.   General: The patient is well-developed and well-nourished and in no acute distress  HEENT:  Head is [place]/AT.  Sclera are anicteric.  Funduscopic exam shows normal optic discs and retinal vessels.  Neck: No carotid bruits are noted.  The neck is nontender.  Cardiovascular: The heart has a regular rate and rhythm with a normal S1 and S2. There were no murmurs, gallops or rubs.    Skin: Extremities are without rash or  edema.  Musculoskeletal:  Back is nontender  Neurologic Exam  Mental status: The patient is alert and oriented x 3 at the time of the examination. The patient has apparent normal recent and remote memory, with an apparently normal attention span and concentration ability.   Speech is normal.  Cranial nerves: Extraocular movements are full. Pupils are equal, round, and  reactive to light and accomodation.  Visual fields are full.  Facial symmetry is present. There is good facial sensation to soft touch bilaterally.Facial strength is normal.  Trapezius and sternocleidomastoid strength is normal. No dysarthria is noted.  The tongue is midline, and the patient has symmetric elevation of the soft palate. No obvious hearing deficits are noted.  Motor:  Muscle bulk is normal.   Tone is normal. Strength is  5 / 5 in all 4 extremities.   Sensory: Sensory testing is intact to pinprick, soft touch and vibration sensation in all 4 extremities.  Coordination: Cerebellar testing reveals good finger-nose-finger and  heel-to-shin bilaterally.  Gait and station: Station is normal.   Gait is normal. Tandem gait is normal. Romberg is negative.   Reflexes: Deep tendon reflexes are symmetric and normal bilaterally.   Plantar responses are flexor.    DIAGNOSTIC DATA (LABS, IMAGING, TESTING) - I reviewed patient records, labs, notes, testing and imaging myself where available.  Lab Results  Component Value Date   WBC 7.8 10/05/2018   HGB 14.6 10/05/2018   HCT 43.2 10/05/2018   MCV 91.9 10/05/2018   PLT 385 10/05/2018      Component Value Date/Time   NA 140 10/05/2018 0938   NA 138 08/26/2013 0912   K 4.0 10/05/2018 0938   CL 105 10/05/2018 0938   CO2 25 10/05/2018 0938   GLUCOSE 111 (H) 10/05/2018 0938   BUN 13 10/05/2018 0938   BUN 8 08/26/2013 0912   CREATININE 0.73 10/05/2018 0938   CALCIUM 9.1 10/05/2018 0938   PROT 6.8 10/05/2018 0938   PROT 7.1 08/26/2013 0912   ALBUMIN 4.6 08/26/2013 0912   AST 16 10/05/2018 0938   ALT 9 10/05/2018 0938   ALKPHOS 59 08/26/2013 0912   BILITOT 0.3 10/05/2018 0938   GFRNONAA 115 10/05/2018 0938   GFRAA 134 10/05/2018 0938   No results found for: CHOL, HDL, LDLCALC, LDLDIRECT, TRIG, CHOLHDL No results found for: YHAJ8R No results found for: VITAMINB12 Lab Results  Component Value Date   TSH 0.69  10/05/2018       ASSESSMENT AND PLAN  Chronic migraine without aura without status migrainosus, not intractable  Migraine without aura and without status migrainosus, not intractable   Continue Botox  as her preventative medication Continue Nurtec with ibuprofen  for breakthrough migraine.  Trial of indomethacin for occasional migraine not responding to Nurtec. F/u with Duwaine Russell for injections Call sooner if new or worsening neurologic symptoms.      Harpreet Pompey A. Vear, MD, [facility] 01/26/2024, 2:36 PM Certified in Neurology, Clinical Neurophysiology, Sleep Medicine and Neuroimaging  [facility] [address], [address] [place], [place] [address] [phone]     [1]  Allergies Allergen Reactions   Mushroom Extract Complex (Obsolete) Anaphylaxis   Other Swelling    Silk tape  Tongue swelling   Ketorolac  Tromethamine  Other (See Comments)    INSOMNIA   Sumatriptan Other (See Comments)    Family allergy   [2]  Current Outpatient Medications:    botulinum toxin Type A  (BOTOX ) 200 units injection, Provider to inject 155 units into the muscles of the head and neck every 12 weeks. Discard remainder., Disp: 1 each, Rfl: 3   EPINEPHrine 0.3 mg/0.3 mL IJ SOAJ injection, Inject 0.3 mg into the skin daily as needed (allergic reaction). , Disp: , Rfl:    ibuprofen  (ADVIL ,MOTRIN ) 200 MG tablet, Take 200 mg by mouth every 6 (six) hours as needed for headache, mild pain or moderate pain., Disp: , Rfl:    levonorgestrel (MIRENA) 20 MCG/DAY IUD, 1 each by Intrauterine route once., Disp: , Rfl:    methylPREDNISolone  (MEDROL  DOSEPAK) 4 MG TBPK tablet, Take as directed., Disp: 1 each, Rfl: 0   NURTEC 75 MG TBDP, DISSOLVE 1 TABLET BY MOUTH AS NEEDED AS DIRECTED, Disp: 16 tablet, Rfl: 5   ondansetron  (ZOFRAN -ODT) 4 MG disintegrating tablet, DISSOLVE 1 TABLET(4 MG) ON THE TONGUE EVERY 8 HOURS AS NEEDED FOR NAUSEA, Disp: 30 tablet, Rfl: 3   traZODone (DESYREL) 50 MG tablet,  Take 25 mg by mouth at bedtime as needed., Disp: , Rfl:   "

## 2024-04-05 ENCOUNTER — Ambulatory Visit: Admitting: Adult Health
